# Patient Record
Sex: Male | Born: 1985
Health system: Southern US, Community
[De-identification: ages and names within clinical notes are randomized; demographics above are authoritative.]

## PROBLEM LIST (undated history)

## (undated) DIAGNOSIS — N4400 Torsion of testis, unspecified: Secondary | ICD-10-CM

## (undated) HISTORY — PX: APPENDECTOMY: SHX54

## (undated) HISTORY — DX: Torsion of testis, unspecified: N44.00

## (undated) HISTORY — PX: TONSILLECTOMY: SUR1361

## (undated) HISTORY — PX: HERNIA REPAIR: SHX51

---

## 2011-04-02 ENCOUNTER — Ambulatory Visit
Admission: RE | Admit: 2011-04-02 | Discharge: 2011-04-02 | Disposition: A | Discharge status: Home / Self Care | Payer: BC Managed Care – PPO | Source: Ambulatory Visit | Attending: Otolaryngology | Admitting: Otolaryngology

## 2011-04-02 ENCOUNTER — Other Ambulatory Visit: Payer: Self-pay | Admitting: Otolaryngology

## 2011-04-02 DIAGNOSIS — J3489 Other specified disorders of nose and nasal sinuses: Secondary | ICD-10-CM

## 2011-04-02 DIAGNOSIS — J343 Hypertrophy of nasal turbinates: Secondary | ICD-10-CM

## 2011-04-02 DIAGNOSIS — J342 Deviated nasal septum: Secondary | ICD-10-CM

## 2011-06-14 ENCOUNTER — Encounter (HOSPITAL_COMMUNITY): Payer: Self-pay | Admitting: *Deleted

## 2011-06-14 ENCOUNTER — Emergency Department (HOSPITAL_COMMUNITY)
Admission: EM | Admit: 2011-06-14 | Discharge: 2011-06-14 | Disposition: A | Discharge status: Home / Self Care | Payer: BC Managed Care – PPO | Attending: Emergency Medicine | Admitting: Emergency Medicine

## 2011-06-14 ENCOUNTER — Emergency Department (HOSPITAL_COMMUNITY): Payer: BC Managed Care – PPO

## 2011-06-14 DIAGNOSIS — R10817 Generalized abdominal tenderness: Secondary | ICD-10-CM | POA: Insufficient documentation

## 2011-06-14 DIAGNOSIS — R109 Unspecified abdominal pain: Secondary | ICD-10-CM | POA: Insufficient documentation

## 2011-06-14 DIAGNOSIS — Z9089 Acquired absence of other organs: Secondary | ICD-10-CM | POA: Insufficient documentation

## 2011-06-14 LAB — DIFFERENTIAL
Lymphocytes Relative: 40 % (ref 12–46)
Lymphs Abs: 4.4 10*3/uL — ABNORMAL HIGH (ref 0.7–4.0)
Monocytes Absolute: 0.7 10*3/uL (ref 0.1–1.0)
Monocytes Relative: 7 % (ref 3–12)
Neutro Abs: 5.5 10*3/uL (ref 1.7–7.7)

## 2011-06-14 LAB — CBC
HCT: 40.7 % (ref 39.0–52.0)
Hemoglobin: 12.8 g/dL — ABNORMAL LOW (ref 13.0–17.0)
WBC: 10.9 10*3/uL — ABNORMAL HIGH (ref 4.0–10.5)

## 2011-06-14 LAB — COMPREHENSIVE METABOLIC PANEL
BUN: 12 mg/dL (ref 6–23)
CO2: 29 mEq/L (ref 19–32)
Chloride: 101 mEq/L (ref 96–112)
Creatinine, Ser: 1.1 mg/dL (ref 0.50–1.35)
GFR calc non Af Amer: >90 mL/min (ref >90)
Total Bilirubin: 0.2 mg/dL — ABNORMAL LOW (ref 0.3–1.2)

## 2011-06-14 LAB — URINALYSIS, ROUTINE W REFLEX MICROSCOPIC
Bilirubin Urine: NEGATIVE
Glucose, UA: NEGATIVE mg/dL
Hgb urine dipstick: NEGATIVE
Ketones, ur: NEGATIVE mg/dL
pH: 6 (ref 5.0–8.0)

## 2011-06-14 LAB — LIPASE, BLOOD: Lipase: 27 U/L (ref 11–59)

## 2011-06-14 MED ORDER — SODIUM CHLORIDE 0.9 % IV BOLUS (SEPSIS)
1000.0000 mL | Freq: Once | INTRAVENOUS | Status: AC
  Administered 2011-06-14: 1000 mL via INTRAVENOUS

## 2011-06-14 NOTE — Discharge Instructions (Signed)
Abdominal Pain (Nonspecific)  Your exam might not show the exact reason you have abdominal pain. Since there are many different causes of abdominal pain, another checkup and more tests may be needed. It is very important to follow up for lasting (persistent) or worsening symptoms. A possible cause of abdominal pain in any person who still has his or her appendix is acute appendicitis. Appendicitis is often hard to diagnose. Normal blood tests, urine tests, ultrasound, and CT scans do not completely rule out early appendicitis or other causes of abdominal pain. Sometimes, only the changes that happen over time will allow appendicitis and other causes of abdominal pain to be determined. Other potential problems that may require surgery may also take time to become more apparent. Because of this, it is important that you follow all of the instructions below.  HOME CARE INSTRUCTIONS    Rest as much as possible.   Do not eat solid food until your pain is gone.   While adults or children have pain: A diet of water, weak decaffeinated tea, broth or bouillon, gelatin, oral rehydration solutions (ORS), frozen ice pops, or ice chips may be helpful.   When pain is gone in adults or children: Start a light diet (dry toast, crackers, applesauce, or white rice). Increase the diet slowly as long as it does not bother you. Eat no dairy products (including cheese and eggs) and no spicy, fatty, fried, or high-fiber foods.   Use no alcohol, caffeine, or cigarettes.   Take your regular medicines unless your caregiver told you not to.   Take any prescribed medicine as directed.   Only take over-the-counter or prescription medicines for pain, discomfort, or fever as directed by your caregiver. Do not give aspirin to children.  If your caregiver has given you a follow-up appointment, it is very important to keep that appointment. Not keeping the appointment could result in a permanent injury and/or lasting (chronic) pain and/or  disability. If there is any problem keeping the appointment, you must call to reschedule.   SEEK IMMEDIATE MEDICAL CARE IF:    Your pain is not gone in 24 hours.   Your pain becomes worse, changes location, or feels different.   You or your child has an oral temperature above 102 F (38.9 C), not controlled by medicine.   Your baby is older than 3 months with a rectal temperature of 102 F (38.9 C) or higher.   Your baby is 3 months old or younger with a rectal temperature of 100.4 F (38 C) or higher.   You have shaking chills.   You keep throwing up (vomiting) or cannot drink liquids.   There is blood in your vomit or you see blood in your bowel movements.   Your bowel movements become dark or black.   You have frequent bowel movements.   Your bowel movements stop (become blocked) or you cannot pass gas.   You have bloody, frequent, or painful urination.   You have yellow discoloration in the skin or whites of the eyes.   Your stomach becomes bloated or bigger.   You have dizziness or fainting.   You have chest or back pain.  MAKE SURE YOU:    Understand these instructions.   Will watch your condition.   Will get help right away if you are not doing well or get worse.  Document Released: 03/15/2005 Document Revised: 03/04/2011 Document Reviewed: 02/10/2009  [employer] Patient Information 2012 [employer].

## 2011-06-14 NOTE — ED Provider Notes (Signed)
History     CSN: [id number]  Arrival date & time 06/14/11  0505   First MD Initiated Contact with Patient 06/14/11 0557      Chief Complaint  Patient presents with  . Abdominal Pain    (Consider location/radiation/quality/duration/timing/severity/associated sxs/prior treatment) HPI  26 year old male with a prior history of appendectomy presents with abdominal pain. Patient states he exhibits a gradual onset of periumbilical abd pain since last night. Described pain as a sharp, stabbing sensation that radiates up and down his abdomen. Pain is constant, sometimes worsened with positional changes. Pain felt similar to this prior appendicitis back in 2001. Patient denies fever, headache, chest pain, shortness of breath, nausea, vomiting, back pain, dysuria, rash. He denies any recent recreational drug use or alcohol use. He denies any recent trauma or recent strenuous exercise. Patient has not tried anything to alleviate his pain. Pain has kept him up all night last night. He denies history of hernia.  Sts he's able to pass flatus.  History reviewed. No pertinent past medical history.  Past Surgical History  Procedure Date  . Appendectomy   . Tonsillectomy     History reviewed. No pertinent family history.  History  Substance Use Topics  . Smoking status: Not on file  . Smokeless tobacco: Not on file  . Alcohol Use: No      Review of Systems  All other systems reviewed and are negative.    Allergies  Review of patient's allergies indicates no known allergies.  Home Medications  No current outpatient prescriptions on file.  BP 152/84  Pulse 77  Temp(Src) 98 F (36.7 C) (Oral)  SpO2 99%  Physical Exam  Nursing note and vitals reviewed. Constitutional: He appears well-developed and well-nourished. No distress.       Awake, alert, nontoxic appearance  HENT:  Head: Atraumatic.  Eyes: Conjunctivae are normal. Right eye exhibits no discharge. Left eye exhibits no  discharge.  Neck: Normal range of motion. Neck supple.  Cardiovascular: Normal rate and regular rhythm.   Pulmonary/Chest: Effort normal. No respiratory distress. He exhibits no tenderness.  Abdominal: Soft. Normal appearance and bowel sounds are normal. He exhibits no shifting dullness, no distension, no abdominal bruit and no ascites. There is generalized tenderness. There is no rigidity, no rebound, no guarding, no CVA tenderness, no tenderness at McBurney's point and negative Murphy's sign. No hernia. Hernia confirmed negative in the ventral area, confirmed negative in the right inguinal area and confirmed negative in the left inguinal area.         Diffuse abdominal pain on palpation, no guarding, no rebound tenderness.  Musculoskeletal: He exhibits no tenderness.       ROM appears intact, no obvious focal weakness  Neurological: He is alert.  Skin: Skin is warm and dry. No rash noted.  Psychiatric: He has a normal mood and affect.    ED Course  Procedures (including critical care time)   Labs Reviewed  CBC  DIFFERENTIAL  COMPREHENSIVE METABOLIC PANEL  LIPASE, BLOOD   No results found.   No diagnosis found.  Results for orders placed during the hospital encounter of 06/14/11  CBC      Component Value Range   WBC 10.9 (*) 4.0 - 10.5 (K/uL)   RBC 5.08  4.22 - 5.81 (MIL/uL)   Hemoglobin 12.8 (*) 13.0 - 17.0 (g/dL)   HCT 40.9  81.1 - 91.4 (%)   MCV 80.1  78.0 - 100.0 (fL)   MCH 25.2 (*) 26.0 - 34.0 (  pg)   MCHC 31.4  30.0 - 36.0 (g/dL)   RDW 09.8  11.9 - 14.7 (%)   Platelets 375  150 - 400 (K/uL)  DIFFERENTIAL      Component Value Range   Neutrophils Relative 50  43 - 77 (%)   Neutro Abs 5.5  1.7 - 7.7 (K/uL)   Lymphocytes Relative 40  12 - 46 (%)   Lymphs Abs 4.4 (*) 0.7 - 4.0 (K/uL)   Monocytes Relative 7  3 - 12 (%)   Monocytes Absolute 0.7  0.1 - 1.0 (K/uL)   Eosinophils Relative 2  0 - 5 (%)   Eosinophils Absolute 0.2  0.0 - 0.7 (K/uL)   Basophils Relative 1   0 - 1 (%)   Basophils Absolute 0.1  0.0 - 0.1 (K/uL)  COMPREHENSIVE METABOLIC PANEL      Component Value Range   Sodium 138  135 - 145 (mEq/L)   Potassium 3.6  3.5 - 5.1 (mEq/L)   Chloride 101  96 - 112 (mEq/L)   CO2 29  19 - 32 (mEq/L)   Glucose, Bld 94  70 - 99 (mg/dL)   BUN 12  6 - 23 (mg/dL)   Creatinine, Ser 8.29  0.50 - 1.35 (mg/dL)   Calcium 9.0  8.4 - 56.2 (mg/dL)   Total Protein 8.1  6.0 - 8.3 (g/dL)   Albumin 3.7  3.5 - 5.2 (g/dL)   AST 21  0 - 37 (U/L)   ALT 20  0 - 53 (U/L)   Alkaline Phosphatase 87  39 - 117 (U/L)   Total Bilirubin 0.2 (*) 0.3 - 1.2 (mg/dL)   GFR calc non Af Amer >90  >90 (mL/min)   GFR calc Af Amer >90  >90 (mL/min)  LIPASE, BLOOD      Component Value Range   Lipase 27  11 - 59 (U/L)  URINALYSIS, ROUTINE W REFLEX MICROSCOPIC      Component Value Range   Color, Urine YELLOW  YELLOW    APPearance CLEAR  CLEAR    Specific Gravity, Urine 1.031 (*) 1.005 - 1.030    pH 6.0  5.0 - 8.0    Glucose, UA NEGATIVE  NEGATIVE (mg/dL)   Hgb urine dipstick NEGATIVE  NEGATIVE    Bilirubin Urine NEGATIVE  NEGATIVE    Ketones, ur NEGATIVE  NEGATIVE (mg/dL)   Protein, ur NEGATIVE  NEGATIVE (mg/dL)   Urobilinogen, UA 1.0  0.0 - 1.0 (mg/dL)   Nitrite NEGATIVE  NEGATIVE    Leukocytes, UA NEGATIVE  NEGATIVE    Dg Abd Acute W/chest  06/14/2011  *RADIOLOGY REPORT*  Clinical Data: Abdominal pain.  ACUTE ABDOMEN SERIES (ABDOMEN 2 VIEW & CHEST 1 VIEW)  Comparison: None.  Findings: The lungs are well-aerated.  Mild vascular congestion is noted.  There is no evidence of focal opacification, pleural effusion or pneumothorax.  The cardiomediastinal silhouette is borderline normal in size.  The visualized bowel gas pattern is unremarkable.  The colon is partially filled with stool; there is no evidence of small bowel dilatation to suggest obstruction.  No free intra-abdominal air is identified on the provided upright view.  No acute osseous abnormalities are seen; the sacroiliac  joints are unremarkable in appearance.  IMPRESSION:  1.  Unremarkable bowel gas pattern; no free intra-abdominal air seen. 2.  Mild vascular congestion noted; lungs remain grossly clear.  Original Report Authenticated By: Tonia Ghent, M.D.      MDM  Periumbilical abd pain since last night.  Hx of appendectomy.  Able to pass flatus.  Is afebrile.  Work up initiated.  Will obtain acute abdomen xray to r/o obstruction due to prior abd surgery  7:33 AM Pt request no pain medication at this time.  Sts his pain has improved from earlier.  Acute abd xray shows no significant finding concerning for obstruction.  Pt has mildly elevated WBC of 10.9.  Normal renal function.  No evidence of hernia.  My attending has evaluated pt and felt that if his UA is negative, pt can be discharge without further work up.    8:20 AM UA shows no signs of infection.  Patient states his pain has been improving. I recommend for him to return to ER if he has any red flags finding. Patient agrees with plan. Patient will be discharged. He has stable normal vital signs and is afebrile.    Fayrene Helper, PA-C 06/14/11 [phone]

## 2011-06-14 NOTE — ED Provider Notes (Signed)
Medical screening examination/treatment/procedure(s) were conducted as a shared visit with non-physician practitioner(s) and myself.  I personally evaluated the patient during the encounter  abd exam and non-surgical--labs reviewed, awaiting urine, likely non-specific abd pain, will likely d/c  Toy Baker, MD 06/14/11 [phone]

## 2011-06-14 NOTE — ED Notes (Signed)
Pt aware he must provide a urine sample.

## 2011-06-18 NOTE — ED Provider Notes (Signed)
Medical screening examination/treatment/procedure(s) were performed by non-physician practitioner and as supervising physician I was immediately available for consultation/collaboration.  Assunta Pupo, MD 06/18/11 1926 

## 2012-05-17 ENCOUNTER — Ambulatory Visit: Payer: BC Managed Care – PPO

## 2012-05-17 ENCOUNTER — Ambulatory Visit: Payer: BC Managed Care – PPO | Admitting: Family Medicine

## 2012-05-17 VITALS — BP 135/80 | HR 88 | Temp 98.5°F | Resp 18 | Ht 75.0 in | Wt >= 6400 oz

## 2012-05-17 DIAGNOSIS — K5289 Other specified noninfective gastroenteritis and colitis: Secondary | ICD-10-CM

## 2012-05-17 DIAGNOSIS — R0602 Shortness of breath: Secondary | ICD-10-CM

## 2012-05-17 LAB — POCT CBC
Granulocyte percent: 52.7 %G (ref 37–80)
HCT, POC: 42.5 % — AB (ref 43.5–53.7)
Hemoglobin: 13.5 g/dL — AB (ref 14.1–18.1)
Lymph, poc: 3.1 (ref 0.6–3.4)
MCH, POC: 25.5 pg — AB (ref 27–31.2)
MCHC: 31.8 g/dL (ref 31.8–35.4)
MCV: 80.1 fL (ref 80–97)
MID (cbc): 0.5 (ref 0–0.9)
MPV: 8.8 fL (ref 0–99.8)
POC Granulocyte: 4.1 (ref 2–6.9)
POC LYMPH PERCENT: 40.2 %L (ref 10–50)
POC MID %: 7.1 %M (ref 0–12)
Platelet Count, POC: 395 10*3/uL (ref 142–424)
RBC: 5.3 M/uL (ref 4.69–6.13)
RDW, POC: 14.1 %
WBC: 7.7 10*3/uL (ref 4.6–10.2)

## 2012-05-17 NOTE — Progress Notes (Signed)
[facility]:  Office Visit  Chief Complaint:  Chief Complaint  Patient presents with  . Shortness of Breath    had stomach virus 2 days ago    HPI: Dustin Collins is a 27 y.o. male who complains of  SOB at rest and also with exertion, this was after he had a bout of the the stomach flu : n/v/diarrhea all stopped yesterday. Taking in fluids, water and gingerale. Ate soup. Had 15 x emesis when this occurred. Nonbloody diarrhea. Started "Sunday morning, went out to eat Saturday night at [employer]. Had chicken fingers and boneless chicken. No recent illness.  He has msk aches. No h/o asthma or allergies. Denies fevers, chills, cough, sinus pressure.   Denies any risk factors for PE. No recent long trips, cancer, recent injuries/surgeries.    Past Medical History  Diagnosis Date  . Testicular torsion     27 y/o   Past Surgical History  Procedure Laterality Date  . Appendectomy    . Tonsillectomy     History   Social History  . Marital Status: Married    Spouse Name: N/A    Number of Children: N/A  . Years of Education: N/A   Social History Main Topics  . Smoking status: Never Smoker   . Smokeless tobacco: None  . Alcohol Use: No  . Drug Use: No  . Sexually Active: Yes   Other Topics Concern  . None   Social History Narrative  . None   Family History  Problem Relation Age of Onset  . Pneumonia Mother    No Known Allergies Prior to Admission medications   Not on File     ROS: The patient denies fevers, chills, night sweats, unintentional weight loss, wheezing, nausea, vomiting, abdominal pain, dysuria, hematuria, melena, numbness, weakness, or tingling.  All other systems have been reviewed and were otherwise negative with the exception of those mentioned in the HPI and as above.    PHYSICAL EXAM: Filed Vitals:   05/17/12 1556  BP: 135/80  Pulse: 88  Temp: 98.5 F (36.9 C)  Resp: 18  Spo2      98" % Filed Vitals:   05/17/12 1556  Height: 6'  3" (1.905 m)  Weight: 418 lb (189.604 kg)   Body mass index is 52.25 kg/(m^2).  General: Alert, no acute distress. Obese AA male HEENT:  Normocephalic, atraumatic, oropharynx patent. EOMI, PERRLA, no exudates. TM nl. Fundoscopic exam nl.  Cardiovascular:  Regular rate and rhythm, no rubs murmurs or gallops.  No Carotid bruits, radial pulse intact. No pedal edema.  Respiratory: Clear to auscultation bilaterally.  No wheezes, rales, or rhonchi.  No cyanosis, no use of accessory musculature GI: No organomegaly, abdomen is soft and non-tender, positive bowel sounds.  No masses. Skin: No rashes. Neurologic: Facial musculature symmetric. Psychiatric: Patient is appropriate throughout our interaction. Lymphatic: No cervical lymphadenopathy Musculoskeletal: Gait intact.   LABS: Results for orders placed in visit on 05/17/12  POCT CBC      Result Value Range   WBC 7.7  4.6 - 10.2 K/uL   Lymph, poc 3.1  0.6 - 3.4   POC LYMPH PERCENT 40.2  10 - 50 %L   MID (cbc) 0.5  0 - 0.9   POC MID % 7.1  0 - 12 %M   POC Granulocyte 4.1  2 - 6.9   Granulocyte percent 52.7  37 - 80 %G   RBC 5.30  4.69 - 6.13 M/uL   Hemoglobin  13.5 (*) 14.1 - 18.1 g/dL   HCT, POC 16.1 (*) 09.6 - 53.7 %   MCV 80.1  80 - 97 fL   MCH, POC 25.5 (*) 27 - 31.2 pg   MCHC 31.8  31.8 - 35.4 g/dL   RDW, POC 04.5     Platelet Count, POC 395  142 - 424 K/uL   MPV 8.8  0 - 99.8 fL     EKG/XRAY:   Primary read interpreted by Dr. Conley Rolls at [facility] No acute cardiopulmonary process   ASSESSMENT/PLAN: Encounter Diagnoses  Name Primary?  . SOB (shortness of breath) Yes  . Other and unspecified noninfectious gastroenteritis and colitis    Most likely related to chestwall pain/SOB due to pain from frequent episodes of emesis from recent gastroenteritis.  No excessive exercise for now, Ibuprofen prn for pain. Advise to take with food.  I do not expect this to be heart related and also unlikley PE since risk factors are low His anemia  is actually improving from prior visits, rectal exam declined If sxs don't improve then f/u prn     LE, THAO PHUONG, DO 05/17/2012 6:09 PM

## 2017-06-01 ENCOUNTER — Other Ambulatory Visit: Payer: Self-pay

## 2017-06-01 ENCOUNTER — Ambulatory Visit: Payer: BC Managed Care – PPO | Admitting: Emergency Medicine

## 2017-06-01 ENCOUNTER — Encounter: Payer: Self-pay | Admitting: Emergency Medicine

## 2017-06-01 VITALS — BP 141/87 | HR 89 | Temp 98.4°F | Resp 18 | Ht 73.03 in | Wt >= 6400 oz

## 2017-06-01 DIAGNOSIS — Z6841 Body Mass Index (BMI) 40.0 and over, adult: Secondary | ICD-10-CM | POA: Diagnosis not present

## 2017-06-01 DIAGNOSIS — Z0000 Encounter for general adult medical examination without abnormal findings: Secondary | ICD-10-CM | POA: Diagnosis not present

## 2017-06-01 LAB — POCT URINALYSIS DIP (MANUAL ENTRY)
Bilirubin, UA: NEGATIVE
Glucose, UA: NEGATIVE mg/dL
Ketones, POC UA: NEGATIVE mg/dL
LEUKOCYTES UA: NEGATIVE
NITRITE UA: NEGATIVE
PH UA: 7 (ref 5.0–8.0)
Protein Ur, POC: NEGATIVE mg/dL
RBC UA: NEGATIVE
Spec Grav, UA: 1.015 (ref 1.010–1.025)
UROBILINOGEN UA: 0.2 U/dL

## 2017-06-01 NOTE — Patient Instructions (Addendum)
   IF you received an x-ray today, you will receive an invoice from [facility]. Please contact [facility] at [phone] with questions or concerns regarding your invoice.   IF you received labwork today, you will receive an invoice from [facility]. Please contact [facility] at [phone] with questions or concerns regarding your invoice.   Our billing staff will not be able to assist you with questions regarding bills from these companies.  You will be contacted with the lab results as soon as they are available. The fastest way to get your results is to activate your My Chart account. Instructions are located on the last page of this paperwork. If you have not heard from us regarding the results in 2 weeks, please contact this office.      Health Maintenance, Male A healthy lifestyle and preventive care is important for your health and wellness. Ask your health care provider about what schedule of regular examinations is right for you. What should I know about weight and diet? Eat a Healthy Diet  Eat plenty of vegetables, fruits, whole grains, low-fat dairy products, and lean protein.  Do not eat a lot of foods high in solid fats, added sugars, or salt.  Maintain a Healthy Weight Regular exercise can help you achieve or maintain a healthy weight. You should:  Do at least 150 minutes of exercise each week. The exercise should increase your heart rate and make you sweat (moderate-intensity exercise).  Do strength-training exercises at least twice a week.  Watch Your Levels of Cholesterol and Blood Lipids  Have your blood tested for lipids and cholesterol every 5 years starting at 31 years of age. If you are at high risk for heart disease, you should start having your blood tested when you are 31 years old. You may need to have your cholesterol levels checked more often if: ? Your lipid or cholesterol levels are high. ? You are older than 31 years of age. ? You  are at high risk for heart disease.  What should I know about cancer screening? Many types of cancers can be detected early and may often be prevented. Lung Cancer  You should be screened every year for lung cancer if: ? You are a current smoker who has smoked for at least 30 years. ? You are a former smoker who has quit within the past 15 years.  Talk to your health care provider about your screening options, when you should start screening, and how often you should be screened.  Colorectal Cancer  Routine colorectal cancer screening usually begins at 31 years of age and should be repeated every 5-10 years until you are 31 years old. You may need to be screened more often if early forms of precancerous polyps or small growths are found. Your health care provider may recommend screening at an earlier age if you have risk factors for colon cancer.  Your health care provider may recommend using home test kits to check for hidden blood in the stool.  A small camera at the end of a tube can be used to examine your colon (sigmoidoscopy or colonoscopy). This checks for the earliest forms of colorectal cancer.  Prostate and Testicular Cancer  Depending on your age and overall health, your health care provider may do certain tests to screen for prostate and testicular cancer.  Talk to your health care provider about any symptoms or concerns you have about testicular or prostate cancer.  Skin Cancer  Check your skin   from head to toe regularly.  Tell your health care provider about any new moles or changes in moles, especially if: ? There is a change in a mole's size, shape, or color. ? You have a mole that is larger than a pencil eraser.  Always use sunscreen. Apply sunscreen liberally and repeat throughout the day.  Protect yourself by wearing long sleeves, pants, a wide-brimmed hat, and sunglasses when outside.  What should I know about heart disease, diabetes, and high blood  pressure?  If you are 18-39 years of age, have your blood pressure checked every 3-5 years. If you are 40 years of age or older, have your blood pressure checked every year. You should have your blood pressure measured twice-once when you are at a hospital or clinic, and once when you are not at a hospital or clinic. Record the average of the two measurements. To check your blood pressure when you are not at a hospital or clinic, you can use: ? An automated blood pressure machine at a pharmacy. ? A home blood pressure monitor.  Talk to your health care provider about your target blood pressure.  If you are between 45-79 years old, ask your health care provider if you should take aspirin to prevent heart disease.  Have regular diabetes screenings by checking your fasting blood sugar level. ? If you are at a normal weight and have a low risk for diabetes, have this test once every three years after the age of 45. ? If you are overweight and have a high risk for diabetes, consider being tested at a younger age or more often.  A one-time screening for abdominal aortic aneurysm (AAA) by ultrasound is recommended for men aged 65-75 years who are current or former smokers. What should I know about preventing infection? Hepatitis B If you have a higher risk for hepatitis B, you should be screened for this virus. Talk with your health care provider to find out if you are at risk for hepatitis B infection. Hepatitis C Blood testing is recommended for:  Everyone born from 1945 through 1965.  Anyone with known risk factors for hepatitis C.  Sexually Transmitted Diseases (STDs)  You should be screened each year for STDs including gonorrhea and chlamydia if: ? You are sexually active and are younger than 31 years of age. ? You are older than 31 years of age and your health care provider tells you that you are at risk for this type of infection. ? Your sexual activity has changed since you were last  screened and you are at an increased risk for chlamydia or gonorrhea. Ask your health care provider if you are at risk.  Talk with your health care provider about whether you are at high risk of being infected with HIV. Your health care provider may recommend a prescription medicine to help prevent HIV infection.  What else can I do?  Schedule regular health, dental, and eye exams.  Stay current with your vaccines (immunizations).  Do not use any tobacco products, such as cigarettes, chewing tobacco, and e-cigarettes. If you need help quitting, ask your health care provider.  Limit alcohol intake to no more than 2 drinks per day. One drink equals 12 ounces of beer, 5 ounces of wine, or 1 ounces of hard liquor.  Do not use street drugs.  Do not share needles.  Ask your health care provider for help if you need support or information about quitting drugs.  Tell your health care   provider if you often feel depressed.  Tell your health care provider if you have ever been abused or do not feel safe at home. This information is not intended to replace advice given to you by your health care provider. Make sure you discuss any questions you have with your health care provider. Document Released: 09/11/2007 Document Revised: 11/12/2015 Document Reviewed: 12/17/2014 [employer] Interactive Patient Education  2018 [employer]   Calorie Counting for Weight Loss Calories are units of energy. Your body needs a certain amount of calories from food to keep you going throughout the day. When you eat more calories than your body needs, your body stores the extra calories as fat. When you eat fewer calories than your body needs, your body burns fat to get the energy it needs. Calorie counting means keeping track of how many calories you eat and drink each day. Calorie counting can be helpful if you need to lose weight. If you make sure to eat fewer calories than your body needs, you should lose weight.  Ask your health care provider what a healthy weight is for you. For calorie counting to work, you will need to eat the right number of calories in a day in order to lose a healthy amount of weight per week. A dietitian can help you determine how many calories you need in a day and will give you suggestions on how to reach your calorie goal.  A healthy amount of weight to lose per week is usually 1-2 lb (0.5-0.9 kg). This usually means that your daily calorie intake should be reduced by 500-750 calories.  Eating 1,200 - 1,500 calories per day can help most women lose weight.  Eating 1,500 - 1,800 calories per day can help most men lose weight.  What is my plan? My goal is to have __________ calories per day. If I have this many calories per day, I should lose around __________ pounds per week. What do I need to know about calorie counting? In order to meet your daily calorie goal, you will need to:  Find out how many calories are in each food you would like to eat. Try to do this before you eat.  Decide how much of the food you plan to eat.  Write down what you ate and how many calories it had. Doing this is called keeping a food log.  To successfully lose weight, it is important to balance calorie counting with a healthy lifestyle that includes regular activity. Aim for 150 minutes of moderate exercise (such as walking) or 75 minutes of vigorous exercise (such as running) each week. Where do I find calorie information?  The number of calories in a food can be found on a Nutrition Facts label. If a food does not have a Nutrition Facts label, try to look up the calories online or ask your dietitian for help. Remember that calories are listed per serving. If you choose to have more than one serving of a food, you will have to multiply the calories per serving by the amount of servings you plan to eat. For example, the label on a package of bread might say that a serving size is 1 slice and that  there are 90 calories in a serving. If you eat 1 slice, you will have eaten 90 calories. If you eat 2 slices, you will have eaten 180 calories. How do I keep a food log? Immediately after each meal, record the following information in your food log:    What you ate. Don't forget to include toppings, sauces, and other extras on the food.  How much you ate. This can be measured in cups, ounces, or number of items.  How many calories each food and drink had.  The total number of calories in the meal.  Keep your food log near you, such as in a small notebook in your pocket, or use a mobile app or website. Some programs will calculate calories for you and show you how many calories you have left for the day to meet your goal. What are some calorie counting tips?  Use your calories on foods and drinks that will fill you up and not leave you hungry: ? Some examples of foods that fill you up are nuts and nut butters, vegetables, lean proteins, and high-fiber foods like whole grains. High-fiber foods are foods with more than 5 g fiber per serving. ? Drinks such as sodas, specialty coffee drinks, alcohol, and juices have a lot of calories, yet do not fill you up.  Eat nutritious foods and avoid empty calories. Empty calories are calories you get from foods or beverages that do not have many vitamins or protein, such as candy, sweets, and soda. It is better to have a nutritious high-calorie food (such as an avocado) than a food with few nutrients (such as a bag of chips).  Know how many calories are in the foods you eat most often. This will help you calculate calorie counts faster.  Pay attention to calories in drinks. Low-calorie drinks include water and unsweetened drinks.  Pay attention to nutrition labels for "low fat" or "fat free" foods. These foods sometimes have the same amount of calories or more calories than the full fat versions. They also often have added sugar, starch, or salt, to make up  for flavor that was removed with the fat.  Find a way of tracking calories that works for you. Get creative. Try different apps or programs if writing down calories does not work for you. What are some portion control tips?  Know how many calories are in a serving. This will help you know how many servings of a certain food you can have.  Use a measuring cup to measure serving sizes. You could also try weighing out portions on a kitchen scale. With time, you will be able to estimate serving sizes for some foods.  Take some time to put servings of different foods on your favorite plates, bowls, and cups so you know what a serving looks like.  Try not to eat straight from a bag or box. Doing this can lead to overeating. Put the amount you would like to eat in a cup or on a plate to make sure you are eating the right portion.  Use smaller plates, glasses, and bowls to prevent overeating.  Try not to multitask (for example, watch TV or use your computer) while eating. If it is time to eat, sit down at a table and enjoy your food. This will help you to know when you are full. It will also help you to be aware of what you are eating and how much you are eating. What are tips for following this plan? Reading food labels  Check the calorie count compared to the serving size. The serving size may be smaller than what you are used to eating.  Check the source of the calories. Make sure the food you are eating is high in vitamins and protein and low in   saturated and trans fats. Shopping  Read nutrition labels while you shop. This will help you make healthy decisions before you decide to purchase your food.  Make a grocery list and stick to it. Cooking  Try to cook your favorite foods in a healthier way. For example, try baking instead of frying.  Use low-fat dairy products. Meal planning  Use more fruits and vegetables. Half of your plate should be fruits and vegetables.  Include lean  proteins like poultry and fish. How do I count calories when eating out?  Ask for smaller portion sizes.  Consider sharing an entree and sides instead of getting your own entree.  If you get your own entree, eat only half. Ask for a box at the beginning of your meal and put the rest of your entree in it so you are not tempted to eat it.  If calories are listed on the menu, choose the lower calorie options.  Choose dishes that include vegetables, fruits, whole grains, low-fat dairy products, and lean protein.  Choose items that are boiled, broiled, grilled, or steamed. Stay away from items that are buttered, battered, fried, or served with cream sauce. Items labeled "crispy" are usually fried, unless stated otherwise.  Choose water, low-fat milk, unsweetened iced tea, or other drinks without added sugar. If you want an alcoholic beverage, choose a lower calorie option such as a glass of wine or light beer.  Ask for dressings, sauces, and syrups on the side. These are usually high in calories, so you should limit the amount you eat.  If you want a salad, choose a garden salad and ask for grilled meats. Avoid extra toppings like bacon, cheese, or fried items. Ask for the dressing on the side, or ask for olive oil and vinegar or lemon to use as dressing.  Estimate how many servings of a food you are given. For example, a serving of cooked rice is  cup or about the size of half a baseball. Knowing serving sizes will help you be aware of how much food you are eating at restaurants. The list below tells you how big or small some common portion sizes are based on everyday objects: ? 1 oz-4 stacked dice. ? 3 oz-1 deck of cards. ? 1 tsp-1 die. ? 1 Tbsp- a ping-pong ball. ? 2 Tbsp-1 ping-pong ball. ?  cup- baseball. ? 1 cup-1 baseball. Summary  Calorie counting means keeping track of how many calories you eat and drink each day. If you eat fewer calories than your body needs, you should lose  weight.  A healthy amount of weight to lose per week is usually 1-2 lb (0.5-0.9 kg). This usually means reducing your daily calorie intake by 500-750 calories.  The number of calories in a food can be found on a Nutrition Facts label. If a food does not have a Nutrition Facts label, try to look up the calories online or ask your dietitian for help.  Use your calories on foods and drinks that will fill you up, and not on foods and drinks that will leave you hungry.  Use smaller plates, glasses, and bowls to prevent overeating. This information is not intended to replace advice given to you by your health care provider. Make sure you discuss any questions you have with your health care provider. Document Released: 03/15/2005 Document Revised: 02/13/2016 Document Reviewed: 02/13/2016 [employer] Interactive Patient Education  2018 [employer]  

## 2017-06-01 NOTE — Progress Notes (Signed)
Dustin Collins 31 y.o.   Chief Complaint  Patient presents with  . Establish Care  . Annual Exam    HISTORY OF PRESENT ILLNESS: This is a 32 y.o. male Here for annual exam; no complaints and no medical concerns.   HPI   Prior to Admission medications   Not on File    No Known Allergies  There are no active problems to display for this patient.   Past Medical History:  Diagnosis Date  . Testicular torsion    32 y/o    Past Surgical History:  Procedure Laterality Date  . APPENDECTOMY    . TONSILLECTOMY      Social History   Socioeconomic History  . Marital status: Married    Spouse name: [place]  . Number of children: 0  . Years of education: Not on file  . Highest education level: Not on file  Social Needs  . Financial resource strain: Not on file  . Food insecurity - worry: Not on file  . Food insecurity - inability: Not on file  . Transportation needs - medical: Not on file  . Transportation needs - non-medical: Not on file  Occupational History  . Not on file  Tobacco Use  . Smoking status: Never Smoker  . Smokeless tobacco: Never Used  Substance and Sexual Activity  . Alcohol use: No  . Drug use: No  . Sexual activity: Yes  Other Topics Concern  . Not on file  Social History Narrative  . Not on file    Family History  Problem Relation Age of Onset  . Pneumonia Mother      Review of Systems  Constitutional: Negative.  Negative for chills and fever.  HENT: Negative.  Negative for congestion, hearing loss, nosebleeds and sore throat.   Eyes: Negative.  Negative for blurred vision and double vision.  Respiratory: Negative.  Negative for cough and shortness of breath.   Cardiovascular: Negative.  Negative for chest pain, palpitations and leg swelling.  Gastrointestinal: Negative.  Negative for abdominal pain, nausea and vomiting.  Genitourinary: Negative.  Negative for dysuria and hematuria.  Musculoskeletal: Negative.  Negative for back  pain, myalgias and neck pain.  Skin: Negative.  Negative for rash.  Neurological: Negative.  Negative for headaches.  Endo/Heme/Allergies: Negative.   All other systems reviewed and are negative.    Physical Exam  Constitutional: He is oriented to person, place, and time. He appears well-developed.  Morbidly obese  HENT:  Head: Normocephalic and atraumatic.  Nose: Nose normal.  Mouth/Throat: Oropharynx is clear and moist.  Eyes: Conjunctivae and EOM are normal. Pupils are equal, round, and reactive to light.  Neck: Normal range of motion. Neck supple.  Cardiovascular: Normal rate, regular rhythm and normal heart sounds.  Pulmonary/Chest: Effort normal and breath sounds normal.  Abdominal: Soft. There is no tenderness. A hernia (Umbilical) is present.  Musculoskeletal: Normal range of motion.  Lymphadenopathy:    He has no cervical adenopathy.  Neurological: He is alert and oriented to person, place, and time.  Skin: Skin is warm and dry. Capillary refill takes less than 2 seconds.  Psychiatric: He has a normal mood and affect. His behavior is normal.     ASSESSMENT & PLAN: [name] was seen today for establish care and annual exam.  Diagnoses and all orders for this visit:  Routine general medical examination at a health care facility -     CBC with Differential -     Comprehensive metabolic panel -  Hemoglobin A1c -     HIV antibody -     TSH -     Amb ref to Medical Nutrition Therapy-MNT -     POCT urinalysis dipstick  Body mass index (BMI) of 50-59.9 in adult [facility])    Patient Instructions       IF you received an x-ray today, you will receive an invoice from [facility]. Please contact [facility] at [phone] with questions or concerns regarding your invoice.   IF you received labwork today, you will receive an invoice from [facility]. Please contact [facility] at [phone] with questions or concerns regarding your invoice.   Our billing  staff will not be able to assist you with questions regarding bills from these companies.  You will be contacted with the lab results as soon as they are available. The fastest way to get your results is to activate your My Chart account. Instructions are located on the last page of this paperwork. If you have not heard from [place] regarding the results in 2 weeks, please contact this office.      Health Maintenance, Male A healthy lifestyle and preventive care is important for your health and wellness. Ask your health care provider about what schedule of regular examinations is right for you. What should I know about weight and diet? Eat a Healthy Diet  Eat plenty of vegetables, fruits, whole grains, low-fat dairy products, and lean protein.  Do not eat a lot of foods high in solid fats, added sugars, or salt.  Maintain a Healthy Weight Regular exercise can help you achieve or maintain a healthy weight. You should:  Do at least 150 minutes of exercise each week. The exercise should increase your heart rate and make you sweat (moderate-intensity exercise).  Do strength-training exercises at least twice a week.  Watch Your Levels of Cholesterol and Blood Lipids  Have your blood tested for lipids and cholesterol every 5 years starting at 31 years of age. If you are at high risk for heart disease, you should start having your blood tested when you are 31 years old. You may need to have your cholesterol levels checked more often if: ? Your lipid or cholesterol levels are high. ? You are older than 31 years of age. ? You are at high risk for heart disease.  What should I know about cancer screening? Many types of cancers can be detected early and may often be prevented. Lung Cancer  You should be screened every year for lung cancer if: ? You are a current smoker who has smoked for at least 30 years. ? You are a former smoker who has quit within the past 15 years.  Talk to your health  care provider about your screening options, when you should start screening, and how often you should be screened.  Colorectal Cancer  Routine colorectal cancer screening usually begins at 31 years of age and should be repeated every 5-10 years until you are 31 years old. You may need to be screened more often if early forms of precancerous polyps or small growths are found. Your health care provider may recommend screening at an earlier age if you have risk factors for colon cancer.  Your health care provider may recommend using home test kits to check for hidden blood in the stool.  A small camera at the end of a tube can be used to examine your colon (sigmoidoscopy or colonoscopy). This checks for the earliest forms of colorectal cancer.  Prostate and Testicular Cancer  Depending on your age and overall health, your health care provider may do certain tests to screen for prostate and testicular cancer.  Talk to your health care provider about any symptoms or concerns you have about testicular or prostate cancer.  Skin Cancer  Check your skin from head to toe regularly.  Tell your health care provider about any new moles or changes in moles, especially if: ? There is a change in a mole's size, shape, or color. ? You have a mole that is larger than a pencil eraser.  Always use sunscreen. Apply sunscreen liberally and repeat throughout the day.  Protect yourself by wearing long sleeves, pants, a wide-brimmed hat, and sunglasses when outside.  What should I know about heart disease, diabetes, and high blood pressure?  If you are 57-8 years of age, have your blood pressure checked every 3-5 years. If you are 77 years of age or older, have your blood pressure checked every year. You should have your blood pressure measured twice-once when you are at a hospital or clinic, and once when you are not at a hospital or clinic. Record the average of the two measurements. To check your blood  pressure when you are not at a hospital or clinic, you can use: ? An automated blood pressure machine at a pharmacy. ? A home blood pressure monitor.  Talk to your health care provider about your target blood pressure.  If you are between 50-79 years old, ask your health care provider if you should take aspirin to prevent heart disease.  Have regular diabetes screenings by checking your fasting blood sugar level. ? If you are at a normal weight and have a low risk for diabetes, have this test once every three years after the age of 48. ? If you are overweight and have a high risk for diabetes, consider being tested at a younger age or more often.  A one-time screening for abdominal aortic aneurysm (AAA) by ultrasound is recommended for men aged 65-75 years who are current or former smokers. What should I know about preventing infection? Hepatitis B If you have a higher risk for hepatitis B, you should be screened for this virus. Talk with your health care provider to find out if you are at risk for hepatitis B infection. Hepatitis C Blood testing is recommended for:  Everyone born from [birth year] through 1964.  Anyone with known risk factors for hepatitis C.  Sexually Transmitted Diseases (STDs)  You should be screened each year for STDs including gonorrhea and chlamydia if: ? You are sexually active and are younger than 31 years of age. ? You are older than 31 years of age and your health care provider tells you that you are at risk for this type of infection. ? Your sexual activity has changed since you were last screened and you are at an increased risk for chlamydia or gonorrhea. Ask your health care provider if you are at risk.  Talk with your health care provider about whether you are at high risk of being infected with HIV. Your health care provider may recommend a prescription medicine to help prevent HIV infection.  What else can I do?  Schedule regular health, dental, and eye  exams.  Stay current with your vaccines (immunizations).  Do not use any tobacco products, such as cigarettes, chewing tobacco, and e-cigarettes. If you need help quitting, ask your health care provider.  Limit alcohol intake to no more than 2  drinks per day. One drink equals 12 ounces of beer, 5 ounces of wine, or 1 ounces of hard liquor.  Do not use street drugs.  Do not share needles.  Ask your health care provider for help if you need support or information about quitting drugs.  Tell your health care provider if you often feel depressed.  Tell your health care provider if you have ever been abused or do not feel safe at home. This information is not intended to replace advice given to you by your health care provider. Make sure you discuss any questions you have with your health care provider. Document Released: 09/11/2007 Document Revised: 11/12/2015 Document Reviewed: 12/17/2014 Elsevier Interactive Patient Education  2018 [employer].   Calorie Counting for [employer] Calories are units of energy. Your body needs a certain amount of calories from food to keep you going throughout the day. When you eat more calories than your body needs, your body stores the extra calories as fat. When you eat fewer calories than your body needs, your body burns fat to get the energy it needs. Calorie counting means keeping track of how many calories you eat and drink each day. Calorie counting can be helpful if you need to lose weight. If you make sure to eat fewer calories than your body needs, you should lose weight. Ask your health care provider what a healthy weight is for you. For calorie counting to work, you will need to eat the right number of calories in a day in order to lose a healthy amount of weight per week. A dietitian can help you determine how many calories you need in a day and will give you suggestions on how to reach your calorie goal.  A healthy amount of weight to lose per  week is usually 1-2 lb (0.5-0.9 kg). This usually means that your daily calorie intake should be reduced by 500-750 calories.  Eating 1,200 - 1,500 calories per day can help most women lose weight.  Eating 1,500 - 1,800 calories per day can help most men lose weight.  What is my plan? My goal is to have __________ calories per day. If I have this many calories per day, I should lose around __________ pounds per week. What do I need to know about calorie counting? In order to meet your daily calorie goal, you will need to:  Find out how many calories are in each food you would like to eat. Try to do this before you eat.  Decide how much of the food you plan to eat.  Write down what you ate and how many calories it had. Doing this is called keeping a food log.  To successfully lose weight, it is important to balance calorie counting with a healthy lifestyle that includes regular activity. Aim for 150 minutes of moderate exercise (such as walking) or 75 minutes of vigorous exercise (such as running) each week. Where do I find calorie information?  The number of calories in a food can be found on a Nutrition Facts label. If a food does not have a Nutrition Facts label, try to look up the calories online or ask your dietitian for help. Remember that calories are listed per serving. If you choose to have more than one serving of a food, you will have to multiply the calories per serving by the amount of servings you plan to eat. For example, the label on a package of bread might say that a serving size is  1 slice and that there are 90 calories in a serving. If you eat 1 slice, you will have eaten 90 calories. If you eat 2 slices, you will have eaten 180 calories. How do I keep a food log? Immediately after each meal, record the following information in your food log:  What you ate. Don't forget to include toppings, sauces, and other extras on the food.  How much you ate. This can be measured in  cups, ounces, or number of items.  How many calories each food and drink had.  The total number of calories in the meal.  Keep your food log near you, such as in a small notebook in your pocket, or use a mobile app or website. Some programs will calculate calories for you and show you how many calories you have left for the day to meet your goal. What are some calorie counting tips?  Use your calories on foods and drinks that will fill you up and not leave you hungry: ? Some examples of foods that fill you up are nuts and nut butters, vegetables, lean proteins, and high-fiber foods like whole grains. High-fiber foods are foods with more than 5 g fiber per serving. ? Drinks such as sodas, specialty coffee drinks, alcohol, and juices have a lot of calories, yet do not fill you up.  Eat nutritious foods and avoid empty calories. Empty calories are calories you get from foods or beverages that do not have many vitamins or protein, such as candy, sweets, and soda. It is better to have a nutritious high-calorie food (such as an avocado) than a food with few nutrients (such as a bag of chips).  Know how many calories are in the foods you eat most often. This will help you calculate calorie counts faster.  Pay attention to calories in drinks. Low-calorie drinks include water and unsweetened drinks.  Pay attention to nutrition labels for "low fat" or "fat free" foods. These foods sometimes have the same amount of calories or more calories than the full fat versions. They also often have added sugar, starch, or salt, to make up for flavor that was removed with the fat.  Find a way of tracking calories that works for you. Get creative. Try different apps or programs if writing down calories does not work for you. What are some portion control tips?  Know how many calories are in a serving. This will help you know how many servings of a certain food you can have.  Use a measuring cup to measure serving  sizes. You could also try weighing out portions on a kitchen scale. With time, you will be able to estimate serving sizes for some foods.  Take some time to put servings of different foods on your favorite plates, bowls, and cups so you know what a serving looks like.  Try not to eat straight from a bag or box. Doing this can lead to overeating. Put the amount you would like to eat in a cup or on a plate to make sure you are eating the right portion.  Use smaller plates, glasses, and bowls to prevent overeating.  Try not to multitask (for example, watch TV or use your computer) while eating. If it is time to eat, sit down at a table and enjoy your food. This will help you to know when you are full. It will also help you to be aware of what you are eating and how much you are eating. What are  tips for following this plan? Reading food labels  Check the calorie count compared to the serving size. The serving size may be smaller than what you are used to eating.  Check the source of the calories. Make sure the food you are eating is high in vitamins and protein and low in saturated and trans fats. Shopping  Read nutrition labels while you shop. This will help you make healthy decisions before you decide to purchase your food.  Make a grocery list and stick to it. Cooking  Try to cook your favorite foods in a healthier way. For example, try baking instead of frying.  Use low-fat dairy products. Meal planning  Use more fruits and vegetables. Half of your plate should be fruits and vegetables.  Include lean proteins like poultry and fish. How do I count calories when eating out?  Ask for smaller portion sizes.  Consider sharing an entree and sides instead of getting your own entree.  If you get your own entree, eat only half. Ask for a box at the beginning of your meal and put the rest of your entree in it so you are not tempted to eat it.  If calories are listed on the menu, choose  the lower calorie options.  Choose dishes that include vegetables, fruits, whole grains, low-fat dairy products, and lean protein.  Choose items that are boiled, broiled, grilled, or steamed. Stay away from items that are buttered, battered, fried, or served with cream sauce. Items labeled "crispy" are usually fried, unless stated otherwise.  Choose water, low-fat milk, unsweetened iced tea, or other drinks without added sugar. If you want an alcoholic beverage, choose a lower calorie option such as a glass of wine or light beer.  Ask for dressings, sauces, and syrups on the side. These are usually high in calories, so you should limit the amount you eat.  If you want a salad, choose a garden salad and ask for grilled meats. Avoid extra toppings like bacon, cheese, or fried items. Ask for the dressing on the side, or ask for olive oil and vinegar or lemon to use as dressing.  Estimate how many servings of a food you are given. For example, a serving of cooked rice is  cup or about the size of half a baseball. Knowing serving sizes will help you be aware of how much food you are eating at restaurants. The list below tells you how big or small some common portion sizes are based on everyday objects: ? 1 oz-4 stacked dice. ? 3 oz-1 deck of cards. ? 1 tsp-1 die. ? 1 Tbsp- a ping-pong ball. ? 2 Tbsp-1 ping-pong ball. ?  cup- baseball. ? 1 cup-1 baseball. Summary  Calorie counting means keeping track of how many calories you eat and drink each day. If you eat fewer calories than your body needs, you should lose weight.  A healthy amount of weight to lose per week is usually 1-2 lb (0.5-0.9 kg). This usually means reducing your daily calorie intake by 500-750 calories.  The number of calories in a food can be found on a Nutrition Facts label. If a food does not have a Nutrition Facts label, try to look up the calories online or ask your dietitian for help.  Use your calories on foods and  drinks that will fill you up, and not on foods and drinks that will leave you hungry.  Use smaller plates, glasses, and bowls to prevent overeating. This information is not intended  to replace advice given to you by your health care provider. Make sure you discuss any questions you have with your health care provider. Document Released: 03/15/2005 Document Revised: 02/13/2016 Document Reviewed: 02/13/2016 Elsevier Interactive Patient Education  2018 [employer] Inc[employer]      Edwina BarthMiguel Khoa Opdahl, MD [facility]

## 2017-06-02 LAB — HIV ANTIBODY (ROUTINE TESTING W REFLEX): HIV SCREEN 4TH GENERATION: NONREACTIVE

## 2017-06-02 LAB — COMPREHENSIVE METABOLIC PANEL
A/G RATIO: 1.1 — AB (ref 1.2–2.2)
ALT: 18 IU/L (ref 0–44)
AST: 20 IU/L (ref 0–40)
Albumin: 4 g/dL (ref 3.5–5.5)
Alkaline Phosphatase: 84 IU/L (ref 39–117)
BUN/Creatinine Ratio: 10 (ref 9–20)
BUN: 9 mg/dL (ref 6–20)
Bilirubin Total: 0.3 mg/dL (ref 0.0–1.2)
CALCIUM: 8.8 mg/dL (ref 8.7–10.2)
CO2: 27 mmol/L (ref 20–29)
Chloride: 100 mmol/L (ref 96–106)
Creatinine, Ser: 0.9 mg/dL (ref 0.76–1.27)
GFR calc Af Amer: 132 mL/min/{1.73_m2} (ref >59)
GFR, EST NON AFRICAN AMERICAN: 114 mL/min/{1.73_m2} (ref >59)
GLUCOSE: 132 mg/dL — AB (ref 65–99)
Globulin, Total: 3.5 g/dL (ref 1.5–4.5)
POTASSIUM: 4 mmol/L (ref 3.5–5.2)
Sodium: 138 mmol/L (ref 134–144)
Total Protein: 7.5 g/dL (ref 6.0–8.5)

## 2017-06-02 LAB — CBC WITH DIFFERENTIAL/PLATELET
BASOS ABS: 0 10*3/uL (ref 0.0–0.2)
Basos: 0 %
EOS (ABSOLUTE): 0.1 10*3/uL (ref 0.0–0.4)
Eos: 2 %
Hematocrit: 40 % (ref 37.5–51.0)
Hemoglobin: 13.2 g/dL (ref 13.0–17.7)
IMMATURE GRANULOCYTES: 0 %
Immature Grans (Abs): 0 10*3/uL (ref 0.0–0.1)
Lymphocytes Absolute: 2.4 10*3/uL (ref 0.7–3.1)
Lymphs: 31 %
MCH: 26.2 pg — ABNORMAL LOW (ref 26.6–33.0)
MCHC: 33 g/dL (ref 31.5–35.7)
MCV: 80 fL (ref 79–97)
MONOS ABS: 0.4 10*3/uL (ref 0.1–0.9)
Monocytes: 5 %
NEUTROS PCT: 62 %
Neutrophils Absolute: 4.8 10*3/uL (ref 1.4–7.0)
PLATELETS: 339 10*3/uL (ref 150–379)
RBC: 5.03 x10E6/uL (ref 4.14–5.80)
RDW: 14.1 % (ref 12.3–15.4)
WBC: 7.9 10*3/uL (ref 3.4–10.8)

## 2017-06-02 LAB — TSH: TSH: 1.7 u[IU]/mL (ref 0.450–4.500)

## 2017-06-02 LAB — HEMOGLOBIN A1C
ESTIMATED AVERAGE GLUCOSE: 120 mg/dL
Hgb A1c MFr Bld: 5.8 % — ABNORMAL HIGH (ref 4.8–5.6)

## 2019-02-28 ENCOUNTER — Encounter: Payer: Self-pay | Admitting: Emergency Medicine

## 2019-02-28 ENCOUNTER — Ambulatory Visit: Payer: BC Managed Care – PPO | Admitting: Emergency Medicine

## 2019-02-28 ENCOUNTER — Other Ambulatory Visit: Payer: Self-pay

## 2019-02-28 VITALS — BP 117/79 | HR 74 | Temp 98.0°F | Resp 16 | Ht 75.0 in | Wt >= 6400 oz

## 2019-02-28 DIAGNOSIS — Z6841 Body Mass Index (BMI) 40.0 and over, adult: Secondary | ICD-10-CM

## 2019-02-28 DIAGNOSIS — Z1329 Encounter for screening for other suspected endocrine disorder: Secondary | ICD-10-CM | POA: Diagnosis not present

## 2019-02-28 DIAGNOSIS — Z13220 Encounter for screening for lipoid disorders: Secondary | ICD-10-CM | POA: Diagnosis not present

## 2019-02-28 DIAGNOSIS — Z130 Encounter for screening for diseases of the blood and blood-forming organs and certain disorders involving the immune mechanism: Secondary | ICD-10-CM | POA: Diagnosis not present

## 2019-02-28 DIAGNOSIS — R7303 Prediabetes: Secondary | ICD-10-CM

## 2019-02-28 DIAGNOSIS — K429 Umbilical hernia without obstruction or gangrene: Secondary | ICD-10-CM

## 2019-02-28 DIAGNOSIS — Z0001 Encounter for general adult medical examination with abnormal findings: Secondary | ICD-10-CM

## 2019-02-28 DIAGNOSIS — Z13228 Encounter for screening for other metabolic disorders: Secondary | ICD-10-CM

## 2019-02-28 LAB — POCT GLYCOSYLATED HEMOGLOBIN (HGB A1C): Hemoglobin A1C: 5.8 % — AB (ref 4.0–5.6)

## 2019-02-28 LAB — GLUCOSE, POCT (MANUAL RESULT ENTRY): POC Glucose: 96 mg/dL (ref 70–99)

## 2019-02-28 NOTE — Progress Notes (Signed)
Dustin Collins 33 y.o.   Chief Complaint  Patient presents with  . Referral    for Hernia-umbilical    HISTORY OF PRESENT ILLNESS: This is a 33 y.o. male here for his annual exam and also requesting referral for umbilical hernia evaluation. Morbidly obese. No chronic medical problems.  No chronic medications. Blood work last year revealed prediabetes. Has no other complaints or medical concerns today. Non-smoker and no EtOH abuser.  HPI   Prior to Admission medications   Not on File    No Known Allergies  There are no active problems to display for this patient.   Past Medical History:  Diagnosis Date  . Testicular torsion    33 y/o    Past Surgical History:  Procedure Laterality Date  . APPENDECTOMY    . TONSILLECTOMY      Social History   Socioeconomic History  . Marital status: Married    Spouse name: [place]  . Number of children: 0  . Years of education: Not on file  . Highest education level: Not on file  Occupational History  . Not on file  Social Needs  . Financial resource strain: Not on file  . Food insecurity    Worry: Not on file    Inability: Not on file  . Transportation needs    Medical: Not on file    Non-medical: Not on file  Tobacco Use  . Smoking status: Never Smoker  . Smokeless tobacco: Never Used  Substance and Sexual Activity  . Alcohol use: No  . Drug use: No  . Sexual activity: Yes  Lifestyle  . Physical activity    Days per week: Not on file    Minutes per session: Not on file  . Stress: Not on file  Relationships  . Social Herbalist on phone: Not on file    Gets together: Not on file    Attends religious service: Not on file    Active member of club or organization: Not on file    Attends meetings of clubs or organizations: Not on file    Relationship status: Not on file  . Intimate partner violence    Fear of current or ex partner: Not on file    Emotionally abused: Not on file    Physically abused:  Not on file    Forced sexual activity: Not on file  Other Topics Concern  . Not on file  Social History Narrative  . Not on file    Family History  Problem Relation Age of Onset  . Pneumonia Mother      Review of Systems  Constitutional: Negative.  Negative for fever.  HENT: Negative.  Negative for congestion, hearing loss and sore throat.   Eyes: Negative.   Respiratory: Negative.  Negative for cough, hemoptysis, shortness of breath and wheezing.   Cardiovascular: Negative.  Negative for chest pain and palpitations.  Gastrointestinal: Negative.  Negative for abdominal pain, blood in stool, diarrhea, melena, nausea and vomiting.  Genitourinary: Negative.  Negative for hematuria.  Musculoskeletal: Negative.  Negative for back pain, myalgias and neck pain.  Skin: Negative.  Negative for rash.  Neurological: Negative.  Negative for dizziness and headaches.  All other systems reviewed and are negative.   Vitals:   02/28/19 1205  BP: 117/79  Pulse: 74  Resp: 16  Temp: 98 F (36.7 C)  SpO2: 97%    Physical Exam Vitals signs reviewed.  Constitutional:      Appearance:  He is obese.  HENT:     Head: Normocephalic.  Eyes:     Extraocular Movements: Extraocular movements intact.     Conjunctiva/sclera: Conjunctivae normal.     Pupils: Pupils are equal, round, and reactive to light.  Neck:     Musculoskeletal: Normal range of motion and neck supple. No muscular tenderness.  Cardiovascular:     Rate and Rhythm: Normal rate and regular rhythm.     Pulses: Normal pulses.     Heart sounds: Normal heart sounds.  Pulmonary:     Effort: Pulmonary effort is normal.     Breath sounds: Normal breath sounds.  Abdominal:     Palpations: Abdomen is soft.     Tenderness: There is no abdominal tenderness.     Hernia: A hernia ([place]) is present.  Musculoskeletal: Normal range of motion.  Lymphadenopathy:     Cervical: No cervical adenopathy.  Skin:    General: Skin is warm and  dry.     Capillary Refill: Capillary refill takes less than 2 seconds.  Neurological:     Mental Status: He is alert and oriented to person, place, and time.  Psychiatric:        Mood and Affect: Mood normal.        Behavior: Behavior normal.    Results for orders placed or performed in visit on 02/28/19 (from the past 24 hour(s))  POCT glucose (manual entry)     Status: None   Collection Time: 02/28/19 12:30 PM  Result Value Ref Range   POC Glucose 96 70 - 99 mg/dl  POCT glycosylated hemoglobin (Hb A1C)     Status: Abnormal   Collection Time: 02/28/19 12:36 PM  Result Value Ref Range   Hemoglobin A1C 5.8 (A) 4.0 - 5.6 %   HbA1c POC (<> result, manual entry)     HbA1c, POC (prediabetic range)     HbA1c, POC (controlled diabetic range)       ASSESSMENT & PLAN: Dustin Collins was seen today for referral.  Diagnoses and all orders for this visit:  Encounter for general adult medical examination with abnormal findings  Prediabetes -     POCT glucose (manual entry) -     POCT glycosylated hemoglobin (Hb A1C)  Screening for deficiency anemia -     CBC with Differential  Screening for lipoid disorders -     Lipid panel  Screening for endocrine, metabolic and immunity disorder -     Comprehensive metabolic panel  Umbilical hernia without obstruction and without gangrene -     Ambulatory referral to General Surgery  Body mass index (BMI) of 50-59.9 in adult (HCC) -     Amb ref to Medical Nutrition Therapy-MNT    Patient Instructions       If you have lab work done today you will be contacted with your lab results within the next 2 weeks.  If you have not heard from us then please contact us. The fastest way to get your results is to register for My Chart.   IF you received an x-ray today, you will [facility] an [facility]. Please contact [facility] at [phone] with questions or concerns regarding your invoice.   IF you received labwork  today, you will receive an invoice from [facility]. Please contact [facility] at [phone] with questions or concerns regarding your invoice.   Our billing staff will not be able to assist you with questions regarding bills from these companies.  You will be  contacted with the lab results as soon as they are available. The fastest way to get your results is to activate your My Chart account. Instructions are located on the last page of this paperwork. If you have not heard from [place] regarding the results in 2 weeks, please contact this office.      Health Maintenance, Male Adopting a healthy lifestyle and getting preventive care are important in promoting health and wellness. Ask your health care provider about:  The right schedule for you to have regular tests and exams.  Things you can do on your own to prevent diseases and keep yourself healthy. What should I know about diet, weight, and exercise? Eat a healthy diet   Eat a diet that includes plenty of vegetables, fruits, low-fat dairy products, and lean protein.  Do not eat a lot of foods that are high in solid fats, added sugars, or sodium. Maintain a healthy weight Body mass index (BMI) is a measurement that can be used to identify possible weight problems. It estimates body fat based on height and weight. Your health care provider can help determine your BMI and help you achieve or maintain a healthy weight. Get regular exercise Get regular exercise. This is one of the most important things you can do for your health. Most adults should:  Exercise for at least 150 minutes each week. The exercise should increase your heart rate and make you sweat (moderate-intensity exercise).  Do strengthening exercises at least twice a week. This is in addition to the moderate-intensity exercise.  Spend less time sitting. Even light physical activity can be beneficial. Watch cholesterol and blood lipids Have your blood tested for lipids and  cholesterol at 32 years of age, then have this test every 5 years. You may need to have your cholesterol levels checked more often if:  Your lipid or cholesterol levels are high.  You are older than 32 years of age.  You are at high risk for heart disease. What should I know about cancer screening? Many types of cancers can be detected early and may often be prevented. Depending on your health history and family history, you may need to have cancer screening at various ages. This may include screening for:  Colorectal cancer.  Prostate cancer.  Skin cancer.  Lung cancer. What should I know about heart disease, diabetes, and high blood pressure? Blood pressure and heart disease  High blood pressure causes heart disease and increases the risk of stroke. This is more likely to develop in people who have high blood pressure readings, are of African descent, or are overweight.  Talk with your health care provider about your target blood pressure readings.  Have your blood pressure checked: ? Every 3-5 years if you are 37-68 years of age. ? Every year if you are 39 years old or older.  If you are between the ages of 47 and 59 and are a current or former smoker, ask your health care provider if you should have a one-time screening for abdominal aortic aneurysm (AAA). Diabetes Have regular diabetes screenings. This checks your fasting blood sugar level. Have the screening done:  Once every three years after age 51 if you are at a normal weight and have a low risk for diabetes.  More often and at a younger age if you are overweight or have a high risk for diabetes. What should I know about preventing infection? Hepatitis B If you have a higher risk for hepatitis B, you should  be screened for this virus. Talk with your health care provider to find out if you are at risk for hepatitis B infection. Hepatitis C Blood testing is recommended for:  Everyone born from [birth year] through 1965.   Anyone with known risk factors for hepatitis C. Sexually transmitted infections (STIs)  You should be screened each year for STIs, including gonorrhea and chlamydia, if: ? You are sexually active and are younger than 32 years of age. ? You are older than 32 years of age and your health care provider tells you that you are at risk for this type of infection. ? Your sexual activity has changed since you were last screened, and you are at increased risk for chlamydia or gonorrhea. Ask your health care provider if you are at risk.  Ask your health care provider about whether you are at high risk for HIV. Your health care provider may recommend a prescription medicine to help prevent HIV infection. If you choose to take medicine to prevent HIV, you should first get tested for HIV. You should then be tested every 3 months for as long as you are taking the medicine. Follow these instructions at home: Lifestyle  Do not use any products that contain nicotine or tobacco, such as cigarettes, e-cigarettes, and chewing tobacco. If you need help quitting, ask your health care provider.  Do not use street drugs.  Do not share needles.  Ask your health care provider for help if you need support or information about quitting drugs. Alcohol use  Do not drink alcohol if your health care provider tells you not to drink.  If you drink alcohol: ? Limit how much you have to 0-2 drinks a day. ? Be aware of how much alcohol is in your drink. In the [place] one drink equals one 12 oz bottle of beer (355 mL), one 5 oz glass of wine (148 mL), or one 1 oz glass of hard liquor (44 mL). General instructions  Schedule regular health, dental, and eye exams.  Stay current with your vaccines.  Tell your health care provider if: ? You often feel depressed. ? You have ever been abused or do not feel safe at home. Summary  Adopting a healthy lifestyle and getting preventive care are important in promoting health and  wellness.  Follow your health care provider's instructions about healthy diet, exercising, and getting tested or screened for diseases.  Follow your health care provider's instructions on monitoring your cholesterol and blood pressure. This information is not intended to replace advice given to you by your health care provider. Make sure you discuss any questions you have with your health care provider. Document Released: 09/11/2007 Document Revised: 03/08/2018 Document Reviewed: 03/08/2018 Elsevier Patient Education  2020 Elsevier Inc.      Edwina Barth, MD [facility]

## 2019-02-28 NOTE — Patient Instructions (Addendum)
   If you have lab work done today you will be contacted with your lab results within the next 2 weeks.  If you have not heard from us then please contact us. The fastest way to get your results is to register for My Chart.   IF you received an x-ray today, you will receive an invoice from [facility]. Please contact [facility] at [phone] with questions or concerns regarding your invoice.   IF you received labwork today, you will receive an invoice from [facility]. Please contact [facility] at [phone] with questions or concerns regarding your invoice.   Our billing staff will not be able to assist you with questions regarding bills from these companies.  You will be contacted with the lab results as soon as they are available. The fastest way to get your results is to activate your My Chart account. Instructions are located on the last page of this paperwork. If you have not heard from us regarding the results in 2 weeks, please contact this office.     Health Maintenance, Male Adopting a healthy lifestyle and getting preventive care are important in promoting health and wellness. Ask your health care provider about:  The right schedule for you to have regular tests and exams.  Things you can do on your own to prevent diseases and keep yourself healthy. What should I know about diet, weight, and exercise? Eat a healthy diet   Eat a diet that includes plenty of vegetables, fruits, low-fat dairy products, and lean protein.  Do not eat a lot of foods that are high in solid fats, added sugars, or sodium. Maintain a healthy weight Body mass index (BMI) is a measurement that can be used to identify possible weight problems. It estimates body fat based on height and weight. Your health care provider can help determine your BMI and help you achieve or maintain a healthy weight. Get regular exercise Get regular exercise. This is one of the most important things you  can do for your health. Most adults should:  Exercise for at least 150 minutes each week. The exercise should increase your heart rate and make you sweat (moderate-intensity exercise).  Do strengthening exercises at least twice a week. This is in addition to the moderate-intensity exercise.  Spend less time sitting. Even light physical activity can be beneficial. Watch cholesterol and blood lipids Have your blood tested for lipids and cholesterol at 32 years of age, then have this test every 5 years. You may need to have your cholesterol levels checked more often if:  Your lipid or cholesterol levels are high.  You are older than 32 years of age.  You are at high risk for heart disease. What should I know about cancer screening? Many types of cancers can be detected early and may often be prevented. Depending on your health history and family history, you may need to have cancer screening at various ages. This may include screening for:  Colorectal cancer.  Prostate cancer.  Skin cancer.  Lung cancer. What should I know about heart disease, diabetes, and high blood pressure? Blood pressure and heart disease  High blood pressure causes heart disease and increases the risk of stroke. This is more likely to develop in people who have high blood pressure readings, are of African descent, or are overweight.  Talk with your health care provider about your target blood pressure readings.  Have your blood pressure checked: ? Every 3-5 years if you are 18-39 years   of age. ? Every year if you are 40 years old or older.  If you are between the ages of 65 and 75 and are a current or former smoker, ask your health care provider if you should have a one-time screening for abdominal aortic aneurysm (AAA). Diabetes Have regular diabetes screenings. This checks your fasting blood sugar level. Have the screening done:  Once every three years after age 45 if you are at a normal weight and have  a low risk for diabetes.  More often and at a younger age if you are overweight or have a high risk for diabetes. What should I know about preventing infection? Hepatitis B If you have a higher risk for hepatitis B, you should be screened for this virus. Talk with your health care provider to find out if you are at risk for hepatitis B infection. Hepatitis C Blood testing is recommended for:  Everyone born from 1945 through 1965.  Anyone with known risk factors for hepatitis C. Sexually transmitted infections (STIs)  You should be screened each year for STIs, including gonorrhea and chlamydia, if: ? You are sexually active and are younger than 32 years of age. ? You are older than 32 years of age and your health care provider tells you that you are at risk for this type of infection. ? Your sexual activity has changed since you were last screened, and you are at increased risk for chlamydia or gonorrhea. Ask your health care provider if you are at risk.  Ask your health care provider about whether you are at high risk for HIV. Your health care provider may recommend a prescription medicine to help prevent HIV infection. If you choose to take medicine to prevent HIV, you should first get tested for HIV. You should then be tested every 3 months for as long as you are taking the medicine. Follow these instructions at home: Lifestyle  Do not use any products that contain nicotine or tobacco, such as cigarettes, e-cigarettes, and chewing tobacco. If you need help quitting, ask your health care provider.  Do not use street drugs.  Do not share needles.  Ask your health care provider for help if you need support or information about quitting drugs. Alcohol use  Do not drink alcohol if your health care provider tells you not to drink.  If you drink alcohol: ? Limit how much you have to 0-2 drinks a day. ? Be aware of how much alcohol is in your drink. In the [place] one drink equals one 12  oz bottle of beer (355 mL), one 5 oz glass of wine (148 mL), or one 1 oz glass of hard liquor (44 mL). General instructions  Schedule regular health, dental, and eye exams.  Stay current with your vaccines.  Tell your health care provider if: ? You often feel depressed. ? You have ever been abused or do not feel safe at home. Summary  Adopting a healthy lifestyle and getting preventive care are important in promoting health and wellness.  Follow your health care provider's instructions about healthy diet, exercising, and getting tested or screened for diseases.  Follow your health care provider's instructions on monitoring your cholesterol and blood pressure. This information is not intended to replace advice given to you by your health care provider. Make sure you discuss any questions you have with your health care provider. Document Released: 09/11/2007 Document Revised: 03/08/2018 Document Reviewed: 03/08/2018 Elsevier Patient Education  2020 Elsevier Inc.  

## 2019-03-01 ENCOUNTER — Encounter: Payer: Self-pay | Admitting: Emergency Medicine

## 2019-03-01 LAB — COMPREHENSIVE METABOLIC PANEL
ALT: 20 IU/L (ref 0–44)
AST: 22 IU/L (ref 0–40)
Albumin/Globulin Ratio: 1.2 (ref 1.2–2.2)
Albumin: 4.2 g/dL (ref 4.0–5.0)
Alkaline Phosphatase: 97 IU/L (ref 39–117)
BUN/Creatinine Ratio: 11 (ref 9–20)
BUN: 10 mg/dL (ref 6–20)
Bilirubin Total: 0.3 mg/dL (ref 0.0–1.2)
CO2: 25 mmol/L (ref 20–29)
Calcium: 8.7 mg/dL (ref 8.7–10.2)
Chloride: 101 mmol/L (ref 96–106)
Creatinine, Ser: 0.95 mg/dL (ref 0.76–1.27)
GFR calc Af Amer: 122 mL/min/{1.73_m2} (ref >59)
GFR calc non Af Amer: 105 mL/min/{1.73_m2} (ref >59)
Globulin, Total: 3.5 g/dL (ref 1.5–4.5)
Glucose: 84 mg/dL (ref 65–99)
Potassium: 4.7 mmol/L (ref 3.5–5.2)
Sodium: 140 mmol/L (ref 134–144)
Total Protein: 7.7 g/dL (ref 6.0–8.5)

## 2019-03-01 LAB — CBC WITH DIFFERENTIAL/PLATELET
Basophils Absolute: 0.1 10*3/uL (ref 0.0–0.2)
Basos: 1 %
EOS (ABSOLUTE): 0.1 10*3/uL (ref 0.0–0.4)
Eos: 2 %
Hematocrit: 40.5 % (ref 37.5–51.0)
Hemoglobin: 13 g/dL (ref 13.0–17.7)
Immature Grans (Abs): 0 10*3/uL (ref 0.0–0.1)
Immature Granulocytes: 0 %
Lymphocytes Absolute: 3.1 10*3/uL (ref 0.7–3.1)
Lymphs: 35 %
MCH: 25.3 pg — ABNORMAL LOW (ref 26.6–33.0)
MCHC: 32.1 g/dL (ref 31.5–35.7)
MCV: 79 fL (ref 79–97)
Monocytes Absolute: 0.5 10*3/uL (ref 0.1–0.9)
Monocytes: 6 %
Neutrophils Absolute: 4.9 10*3/uL (ref 1.4–7.0)
Neutrophils: 56 %
Platelets: 373 10*3/uL (ref 150–450)
RBC: 5.14 x10E6/uL (ref 4.14–5.80)
RDW: 13.8 % (ref 11.6–15.4)
WBC: 8.8 10*3/uL (ref 3.4–10.8)

## 2019-03-01 LAB — LIPID PANEL
Chol/HDL Ratio: 4.3 ratio (ref 0.0–5.0)
Cholesterol, Total: 136 mg/dL (ref 100–199)
HDL: 32 mg/dL — ABNORMAL LOW (ref >39)
LDL Chol Calc (NIH): 90 mg/dL (ref 0–99)
Triglycerides: 66 mg/dL (ref 0–149)
VLDL Cholesterol Cal: 14 mg/dL (ref 5–40)

## 2019-03-08 ENCOUNTER — Encounter: Payer: BC Managed Care – PPO | Attending: Emergency Medicine | Admitting: Registered"

## 2019-03-08 ENCOUNTER — Encounter: Payer: Self-pay | Admitting: Registered"

## 2019-03-08 ENCOUNTER — Other Ambulatory Visit: Payer: Self-pay

## 2019-03-08 DIAGNOSIS — R7303 Prediabetes: Secondary | ICD-10-CM | POA: Diagnosis present

## 2019-03-08 NOTE — Progress Notes (Signed)
Medical Nutrition Therapy:  Appt start time: 1411 end time:  1515.   Assessment:  Primary concerns today: Dustin Collins referred due to Prediabetes.  Dustin Collins seeking RD guidance on the entirety of forming healthy lifestyle habits, including dietary choices.  Food Allergies/Intolerances: Suspected lactose intolerance. Dustin Collins reports that dairy makes him have to use the restroom quickly. Reports trying lactose free options (almond, soy) with no symptoms.   GI Concerns:  None reported  Pertinent Lab Values: 02/28/2019  A1c-5.8  HDL- 32   Weight Hx:  Preferred Learning Style:   No preference indicated   Learning Readiness:   Ready   MEDICATIONS: Dustin Collins reports taking a daily multivitamin   DIETARY INTAKE: Dustin Collins reports usually missing breakfast (5 days a week, usually weekdays), and having a late lunch and dinner. Snacking on and off.  Snack choices based on convenience. Dustin Collins reports bringing snacks to work from home more recently.    Usual eating pattern includes 2-3 meals and varying snacks per day.   Common foods: Rice (all types), Ground [place].  Avoided foods: Canned foods.    Typical Snacks: Fruits, crackers (Lance/cheddar).     Typical Beverages: 100% juices, minute maid juices, sometimes sodas, water (40-80 oz/day).  Location of Meals: At home, in the kitchen. Prefers to sit down and eat.  Electronics Present at [employer]: If alone, Yes (movies). Not when eating with wife.  Preferred/Accepted Foods:  Grains/Starches: Rice Proteins: [place] (products) Vegetables: Broccoli, carrots, cauliflower, asparagus Fruits: Grapes, strawberries, pineapples, cantaloupe,. Dairy: Milk (tolerates GI discomfort from lactose intolerance) Sauces/Dips/Spreads: Mrs. Dash Beverages: Fruit Juices, Minute maid, Nestle Splash water Other:  24-hr recall:   Woke up around 7  B (9:30 AM): blueberry muffin, 2 bottles of water Snk (11:30 AM):[place] bacon, egg, and cheese biscuit , hashbrown, large Pepsi   L ( 3 PM): Venti Starbucks caramel macchiato, Bacon egg and cheese croissant Snk ( PM): None reported D (7 PM): Ground [place], 2 slices of bread, mixed veggies (cabbage, onions, etc), Minutemaid blueberry lemonade juice. Snk (10-11:00 PM): Cheddar popcorn. Beverages: water, Pepsi, Juice  Usual physical activity: Lifting weights, walking, some running, jumping rope, sports(formerly played football) in general. 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 Dustin Collins reports he attempted to exercise vigorously recently, and felt as if he overexerted himself.    Progress Towards Goal(s):  In progress.   Nutritional Diagnosis:  NB-1.7 Undesireable food choices As related to Knowledge deficit.  As evidenced by Clients desire to learn proper dietary composition for healthy nutrition..    Intervention:  Nutrition Counseling. Dietetic Intern Hewitt Blade) provided counseling for appointment under supervision of dietitian. Educated patient on importance of moderation, and the difference between labeling foods as good or bad, as opposed to items we can have in more or less moderation. Counseled patient on various ways to still include favorite foods and beverages, while making healthier choices. Highlight patient's current physical activity level (walks a lot for work), and reinforce self efficacy in moving his body. Educated patient on importance of health, not weight. Included elaborating on the numerous biomarkers he had within the normal ranges. Educated patient on attempting to make half of his meals be non-starchy vegetables, one quarter starches, and one quarter protein. Dustin Collins appeared agreeable to information/goals discussed.   Goals:   Try to drink half juice, half water instead of pure juice.  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  Eat a variety of fruits!  Continue to take the stairs to class. Try to do it once more per week.  Try to get in breakfast once more a week.  Go with your wife to exercise.  Make sure to get in three meals per day. Try to have balanced meals like the My Plate example (see handout). Include lean proteins, vegetables, fruits, and whole grains at meals.   Teaching Method Utilized:  Visual Auditory Hands on  Handouts given during visit include:  Diabetic Plate  Food List  Barriers to learning/adherence to lifestyle change: None identified  Demonstrated degree of understanding via:  Teach Back   Monitoring/Evaluation:  Dietary intake, exercise, and body weight in 1 month(s).

## 2019-03-08 NOTE — Patient Instructions (Addendum)
Goals:   Try to drink half juice, half water instead of pure juice.  Try Fairlife, or Lactaid milk products, and assess GI tolerance  Gr[place] at [place] instead of a Venti  Choose to have breakfast within an hour of waking.  Eat a variety of fruits!  Continue to take the stairs to class. Try to do it once more per week.  Try to get in breakfast once more a week.  Go with your wife to exercise.  Make sure to get in three meals per day. Try to have balanced meals like the My Plate example (see handout). Include lean proteins, vegetables, fruits, and whole grains at meals.

## 2019-04-23 ENCOUNTER — Ambulatory Visit: Payer: BC Managed Care – PPO | Admitting: Registered"

## 2019-06-08 ENCOUNTER — Ambulatory Visit: Payer: Self-pay

## 2019-06-11 ENCOUNTER — Other Ambulatory Visit: Payer: Self-pay | Admitting: Surgery

## 2019-06-11 DIAGNOSIS — K439 Ventral hernia without obstruction or gangrene: Secondary | ICD-10-CM

## 2019-06-26 ENCOUNTER — Ambulatory Visit
Admission: RE | Admit: 2019-06-26 | Discharge: 2019-06-26 | Disposition: A | Discharge status: Home / Self Care | Payer: BC Managed Care – PPO | Source: Ambulatory Visit | Attending: Surgery | Admitting: Surgery

## 2019-06-26 ENCOUNTER — Other Ambulatory Visit: Payer: Self-pay

## 2019-06-26 DIAGNOSIS — K439 Ventral hernia without obstruction or gangrene: Secondary | ICD-10-CM

## 2019-06-26 MED ORDER — IOPAMIDOL (ISOVUE-300) INJECTION 61%
125.0000 mL | Freq: Once | INTRAVENOUS | Status: AC | PRN
Start: 2019-06-26 — End: 2019-06-26
  Administered 2019-06-26: 125 mL via INTRAVENOUS

## 2019-08-13 ENCOUNTER — Encounter: Payer: Self-pay | Admitting: Emergency Medicine

## 2019-10-29 ENCOUNTER — Ambulatory Visit
Admission: RE | Admit: 2019-10-29 | Discharge: 2019-10-29 | Disposition: A | Discharge status: Home / Self Care | Payer: BC Managed Care – PPO | Source: Ambulatory Visit | Attending: Emergency Medicine | Admitting: Emergency Medicine

## 2019-10-29 ENCOUNTER — Other Ambulatory Visit: Payer: Self-pay

## 2019-10-29 VITALS — BP 143/83 | HR 82 | Temp 97.6°F | Resp 16

## 2019-10-29 DIAGNOSIS — J020 Streptococcal pharyngitis: Secondary | ICD-10-CM | POA: Diagnosis not present

## 2019-10-29 LAB — POCT RAPID STREP A (OFFICE): Rapid Strep A Screen: POSITIVE — AB

## 2019-10-29 MED ORDER — PENICILLIN G BENZATHINE 1200000 UNIT/2ML IM SUSP
1.2000 10*6.[IU] | Freq: Once | INTRAMUSCULAR | Status: AC
  Administered 2019-10-29: 1.2 10*6.[IU] via INTRAMUSCULAR

## 2019-10-29 NOTE — ED Provider Notes (Signed)
Dustin Collins    CSN: [id number] Arrival date & time: 10/29/19  6073      History   Chief Complaint Chief Complaint  Patient presents with  . Nasal Congestion  . Otalgia  . Sore Throat    HPI Dustin Collins is a 34 y.o. male.  Patient presents with sore throat, earache, nasal congestion x1 day. He states it is painful to swallow but he is able to eat and drink. He denies fever, chills, rash, arthralgias, shortness of breath, vomiting, diarrhea, or other symptoms. No treatment attempted at home.  The history is provided by the patient.    Past Medical History:  Diagnosis Date  . Testicular torsion    34 y/o    There are no problems to display for this patient.   Past Surgical History:  Procedure Laterality Date  . APPENDECTOMY    . TONSILLECTOMY         Home Medications    Prior to Admission medications   Medication Sig Start Date End Date Taking? Authorizing Provider  Multiple Vitamin (MULTIVITAMIN) capsule Take 1 capsule by mouth daily.    [provider]    Family History Family History  Problem Relation Age of Onset  . Pneumonia Mother     Social History Social History   Tobacco Use  . Smoking status: Never Smoker  . Smokeless tobacco: Never Used  Vaping Use  . Vaping Use: Never used  Substance Use Topics  . Alcohol use: No  . Drug use: No     Allergies   Patient has no known allergies.   Review of Systems Review of Systems  Constitutional: Negative for chills and fever.  HENT: Positive for congestion, ear pain and sore throat.   Eyes: Negative for pain and visual disturbance.  Respiratory: Negative for cough and shortness of breath.   Cardiovascular: Negative for chest pain and palpitations.  Gastrointestinal: Negative for abdominal pain, diarrhea and vomiting.  Genitourinary: Negative for dysuria and hematuria.  Musculoskeletal: Negative for arthralgias and back pain.  Skin: Negative for color change and rash.    Neurological: Negative for seizures and syncope.  All other systems reviewed and are negative.    Physical Exam Triage Vital Signs ED Triage Vitals  Enc Vitals Group     BP      Pulse      Resp      Temp      Temp src      SpO2      Weight      Height      Head Circumference      Peak Flow      Pain Score      Pain Loc      Pain Edu?      Excl. in GC?    No data found.  Updated Vital Signs BP (!) 143/83 (BP Location: Right Arm)   Pulse 82   Temp 97.6 F (36.4 C)   Resp 16   SpO2 95%   Visual Acuity Right Eye Distance:   Left Eye Distance:   Bilateral Distance:    Right Eye Near:   Left Eye Near:    Bilateral Near:     Physical Exam Vitals and nursing note reviewed.  Constitutional:      General: He is not in acute distress.    Appearance: He is well-developed.  HENT:     Head: Normocephalic and atraumatic.     Right Ear: Tympanic membrane normal.  Left Ear: Tympanic membrane normal.     Mouth/Throat:     Mouth: Mucous membranes are moist.     Pharynx: Posterior oropharyngeal erythema present.  Eyes:     Conjunctiva/sclera: Conjunctivae normal.  Cardiovascular:     Rate and Rhythm: Normal rate and regular rhythm.     Heart sounds: No murmur heard.   Pulmonary:     Effort: Pulmonary effort is normal. No respiratory distress.     Breath sounds: Normal breath sounds.  Abdominal:     Palpations: Abdomen is soft.     Tenderness: There is no abdominal tenderness. There is no guarding or rebound.  Musculoskeletal:     Cervical back: Neck supple.  Skin:    General: Skin is warm and dry.     Findings: No rash.  Neurological:     General: No focal deficit present.     Mental Status: He is alert and oriented to person, place, and time.     Gait: Gait normal.  Psychiatric:        Mood and Affect: Mood normal.        Behavior: Behavior normal.      UC Treatments / Results  Labs (all labs ordered are listed, but only abnormal results are  displayed) Labs Reviewed  POCT RAPID STREP A (OFFICE) - Abnormal; Notable for the following components:      Result Value   Rapid Strep A Screen Positive (*)    All other components within normal limits  NOVEL CORONAVIRUS, NAA    EKG   Radiology No results found.  Procedures Procedures (including critical care time)  Medications Ordered in UC Medications  penicillin g benzathine (BICILLIN LA) 1200000 UNIT/2ML injection 1.2 Million Units (1.2 Million Units Intramuscular Given 10/29/19 1048)    Initial Impression / Assessment and Plan / UC Course  I have reviewed the triage vital signs and the nursing notes.  Pertinent labs & imaging results that were available during my care of the patient were reviewed by me and considered in my medical decision making (see chart for details).   Strep throat. Treated with Bicillin LA. Instructed patient to take Tylenol or ibuprofen as needed for fever or discomfort. PCR COVID pending; instructed patient to self quarantine until the test result is back. Instructed him to follow-up with his PCP if his symptoms are not improving. Patient agrees to plan of care.     Final Clinical Impressions(s) / UC Diagnoses   Final diagnoses:  Streptococcal sore throat     Discharge Instructions     You have strep throat.    You were given an injection of penicillin. There is no need for additional antibiotics at this time.    Take Tylenol or ibuprofen as needed for fever or discomfort.    Your Covid test is pending. You should self quarantine until the test result is back.    Follow up with your primary care provider if your symptoms are not improving.       ED Prescriptions    None     PDMP not reviewed this encounter.   Mickie Bail, NP 10/29/19 1049

## 2019-10-29 NOTE — Discharge Instructions (Signed)
You have strep throat.    You were given an injection of penicillin. There is no need for additional antibiotics at this time.    Take Tylenol or ibuprofen as needed for fever or discomfort.    Your Covid test is pending. You should self quarantine until the test result is back.    Follow up with your primary care provider if your symptoms are not improving.

## 2019-10-29 NOTE — ED Triage Notes (Signed)
Pt presents to UC for nasal congestion, left ear ache, and left sided throat pain. Pt states he feels like left face and throat are swollen. Pt states swallowing is painful. Pt states symptoms have been present since last night. Pt denies fevers. Pt denies OTC treatments or relieving factors.

## 2019-10-30 LAB — SARS-COV-2, NAA 2 DAY TAT

## 2019-10-30 LAB — NOVEL CORONAVIRUS, NAA: SARS-CoV-2, NAA: NOT DETECTED

## 2019-11-02 ENCOUNTER — Ambulatory Visit
Admission: EM | Admit: 2019-11-02 | Discharge: 2019-11-02 | Disposition: A | Discharge status: Home / Self Care | Payer: BC Managed Care – PPO | Attending: Emergency Medicine | Admitting: Emergency Medicine

## 2019-11-02 DIAGNOSIS — R110 Nausea: Secondary | ICD-10-CM | POA: Diagnosis not present

## 2019-11-02 DIAGNOSIS — R42 Dizziness and giddiness: Secondary | ICD-10-CM

## 2019-11-02 DIAGNOSIS — H9202 Otalgia, left ear: Secondary | ICD-10-CM | POA: Diagnosis not present

## 2019-11-02 DIAGNOSIS — K0889 Other specified disorders of teeth and supporting structures: Secondary | ICD-10-CM

## 2019-11-02 MED ORDER — IBUPROFEN 800 MG PO TABS: 800.0000 mg | ORAL_TABLET | Freq: Three times a day (TID) | ORAL | 0 refills | Status: DC | PRN

## 2019-11-02 MED ORDER — ONDANSETRON HCL 4 MG PO TABS: 4.0000 mg | ORAL_TABLET | Freq: Four times a day (QID) | ORAL | 0 refills | Status: DC | PRN

## 2019-11-02 NOTE — ED Triage Notes (Signed)
Patient complains of left ear pain, dental pain, and dizziness x4 days.

## 2019-11-02 NOTE — ED Provider Notes (Signed)
Dustin Collins    CSN: [id number] Arrival date & time: 11/02/19  4270      History   Chief Complaint Chief Complaint  Patient presents with  . Otalgia  . Dental Pain  . Dizziness    HPI Dustin Collins is a 34 y.o. male.   Patient presents with 4-day history of left ear pain, left upper tooth ache, dizziness, nausea.  He was seen here on 10/29/2019; diagnosed with strep throat; treated with Bicillin LA; negative for COVID.  He denies focal weakness, dizziness, fever, chills, rash, chest pain, cough, shortness of breath, vomiting, diarrhea, or other symptoms.  The history is provided by the patient.    Past Medical History:  Diagnosis Date  . Testicular torsion    34 y/o    There are no problems to display for this patient.   Past Surgical History:  Procedure Laterality Date  . APPENDECTOMY    . TONSILLECTOMY         Home Medications    Prior to Admission medications   Medication Sig Start Date End Date Taking? Authorizing Provider  ibuprofen (ADVIL) 800 MG tablet Take 1 tablet (800 mg total) by mouth every 8 (eight) hours as needed. 11/02/19   Mickie Bail, NP  Multiple Vitamin (MULTIVITAMIN) capsule Take 1 capsule by mouth daily.    [provider]  ondansetron (ZOFRAN) 4 MG tablet Take 1 tablet (4 mg total) by mouth every 6 (six) hours as needed for nausea or vomiting. 11/02/19   Mickie Bail, NP    Family History Family History  Problem Relation Age of Onset  . Pneumonia Mother     Social History Social History   Tobacco Use  . Smoking status: Never Smoker  . Smokeless tobacco: Never Used  Vaping Use  . Vaping Use: Never used  Substance Use Topics  . Alcohol use: No  . Drug use: No     Allergies   Patient has no known allergies.   Review of Systems Review of Systems  Constitutional: Negative for chills and fever.  HENT: Positive for dental problem and ear pain. Negative for sore throat.   Eyes: Negative for pain and visual  disturbance.  Respiratory: Negative for cough and shortness of breath.   Cardiovascular: Negative for chest pain and palpitations.  Gastrointestinal: Positive for nausea. Negative for abdominal pain, diarrhea and vomiting.  Genitourinary: Negative for dysuria and hematuria.  Musculoskeletal: Negative for arthralgias and back pain.  Skin: Negative for color change and rash.  Neurological: Positive for dizziness. Negative for seizures, syncope, facial asymmetry, speech difficulty, weakness, numbness and headaches.  All other systems reviewed and are negative.    Physical Exam Triage Vital Signs ED Triage Vitals  Enc Vitals Group     BP      Pulse      Resp      Temp      Temp src      SpO2      Weight      Height      Head Circumference      Peak Flow      Pain Score      Pain Loc      Pain Edu?      Excl. in GC?    No data found.  Updated Vital Signs BP 126/83   Pulse 79   Temp 98.6 F (37 C)   Resp 20   SpO2 95%   Visual Acuity Right Eye  Distance:   Left Eye Distance:   Bilateral Distance:    Right Eye Near:   Left Eye Near:    Bilateral Near:     Physical Exam Vitals and nursing note reviewed.  Constitutional:      General: He is not in acute distress.    Appearance: He is well-developed. He is obese. He is not ill-appearing.  HENT:     Head: Normocephalic and atraumatic.     Right Ear: Tympanic membrane and ear canal normal.     Left Ear: Tympanic membrane and ear canal normal.     Nose: Nose normal.     Mouth/Throat:     Mouth: Mucous membranes are moist.     Pharynx: Oropharynx is clear.   Eyes:     Conjunctiva/sclera: Conjunctivae normal.  Cardiovascular:     Rate and Rhythm: Normal rate and regular rhythm.     Heart sounds: No murmur heard.   Pulmonary:     Effort: Pulmonary effort is normal. No respiratory distress.     Breath sounds: Normal breath sounds. No wheezing or rhonchi.  Abdominal:     Palpations: Abdomen is soft.      Tenderness: There is no abdominal tenderness. There is no guarding or rebound.  Musculoskeletal:     Cervical back: Neck supple.  Skin:    General: Skin is warm and dry.     Findings: No rash.  Neurological:     General: No focal deficit present.     Mental Status: He is alert and oriented to person, place, and time.     Sensory: No sensory deficit.     Motor: No weakness.     Gait: Gait normal.  Psychiatric:        Mood and Affect: Mood normal.        Behavior: Behavior normal.      UC Treatments / Results  Labs (all labs ordered are listed, but only abnormal results are displayed) Labs Reviewed - No data to display  EKG   Radiology No results found.  Procedures Procedures (including critical care time)  Medications Ordered in UC Medications - No data to display  Initial Impression / Assessment and Plan / UC Course  I have reviewed the triage vital signs and the nursing notes.  Pertinent labs & imaging results that were available during my care of the patient were reviewed by me and considered in my medical decision making (see chart for details).   Dental pain, left otalgia, dizziness, nausea.  Treating dental pain with ibuprofen.  Treating nausea with Zofran.  Discussed safety with dizziness and handout provided.  Handouts also provided on dental pain, earache, nausea.  Dental resource guide provided and patient instructed to make the soonest available appointment with a dentist.  Instructed patient to follow-up with his PCP if his symptoms are not improving.  Patient agrees to plan of care.     Final Clinical Impressions(s) / UC Diagnoses   Final diagnoses:  Pain, dental  Otalgia of left ear  Dizziness  Nausea without vomiting     Discharge Instructions     Take the ibuprofen as needed for discomfort.  Take the Zofran as needed for nausea.    See the attached handouts about dental pain, earache, dizziness, and nausea.   A dental resource guide is  attached.  Please call to make an appointment with a dentist as soon as possible.    Follow up with your primary care provider if your  symptoms are not improving.         ED Prescriptions    Medication Sig Dispense Auth. Provider   ibuprofen (ADVIL) 800 MG tablet Take 1 tablet (800 mg total) by mouth every 8 (eight) hours as needed. 21 tablet Mickie Bail, NP   ondansetron (ZOFRAN) 4 MG tablet Take 1 tablet (4 mg total) by mouth every 6 (six) hours as needed for nausea or vomiting. 12 tablet Mickie Bail, NP     I have reviewed the PDMP during this encounter.   Mickie Bail, NP 11/02/19 [phone]

## 2019-11-02 NOTE — Discharge Instructions (Addendum)
Take the ibuprofen as needed for discomfort.  Take the Zofran as needed for nausea.    See the attached handouts about dental pain, earache, dizziness, and nausea.   A dental resource guide is attached.  Please call to make an appointment with a dentist as soon as possible.    Follow up with your primary care provider if your symptoms are not improving.

## 2020-08-18 IMAGING — CT CT ABD-PELV W/ CM
1 of 2 series · 14 of 32 positions shown, 19 images · IV contrast (APPLIED)
Comparison: None.

CLINICAL DATA: Central abdominal pain.

EXAM:
CT ABDOMEN AND PELVIS WITH CONTRAST
TECHNIQUE: Multidetector CT imaging of the abdomen and pelvis was performed
using the standard protocol following bolus administration of
intravenous contrast.
CONTRAST:  125mL JCQ9Q6-TJJ IOPAMIDOL (JCQ9Q6-TJJ) INJECTION 61%

[Series 2: abd/pelvis w/cm · axial · 1.27mm/px · z∈[-583,-153]mm · 14 of 98 slices shown, 19 images]
[im 6/98  soft-tissue]
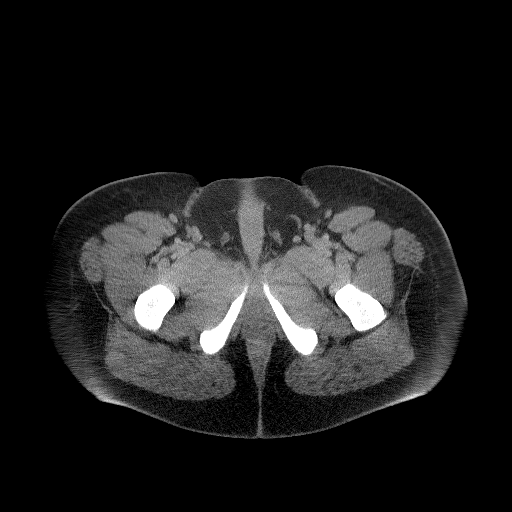
[im 6/98  bone]
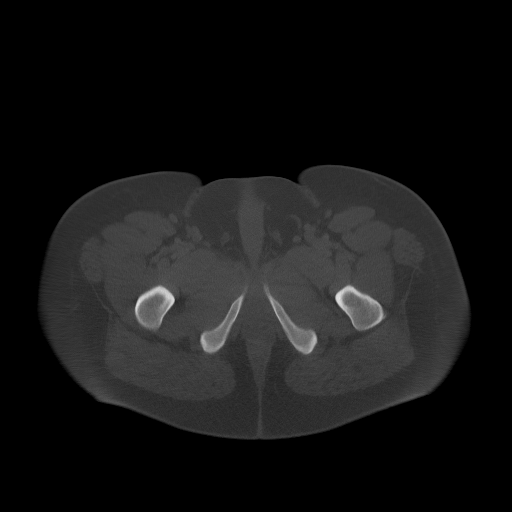
[im 16/98  soft-tissue]
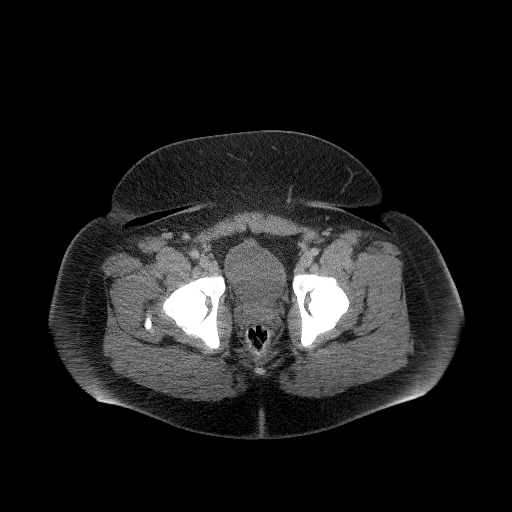
[im 21/98  soft-tissue]
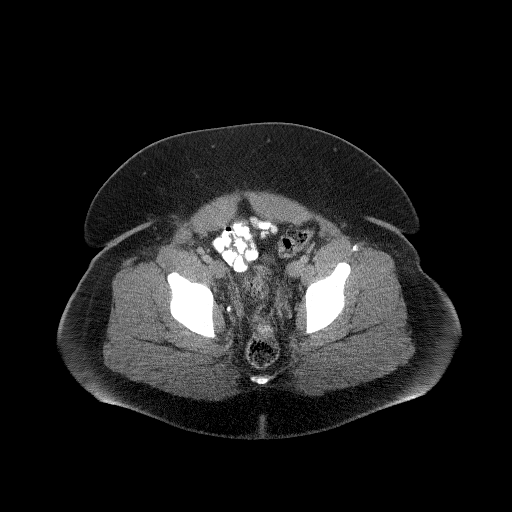
[im 26/98  soft-tissue]
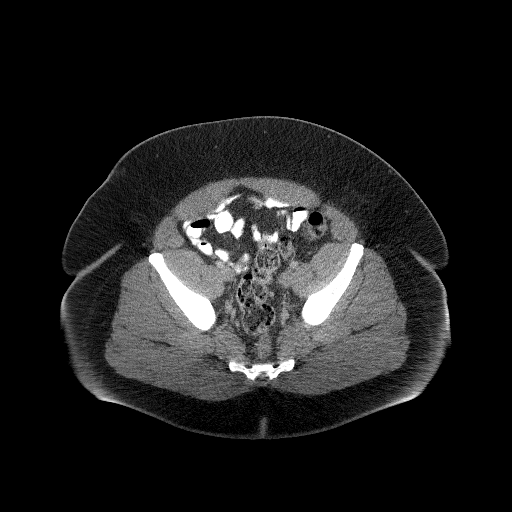
[im 36/98  soft-tissue]
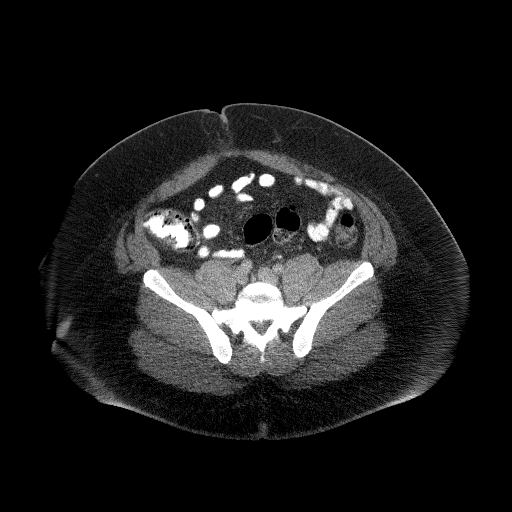
[im 41/98  soft-tissue]
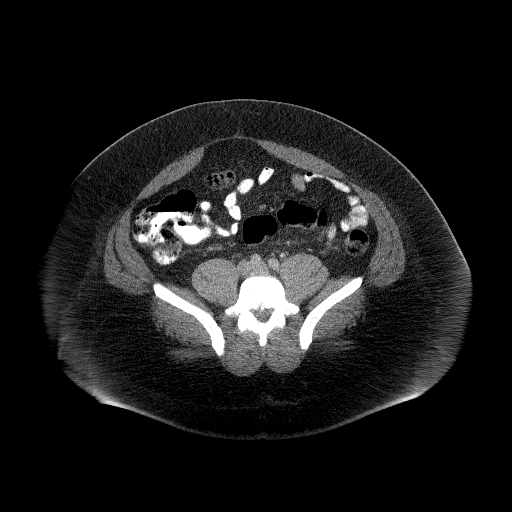
[im 52/98  soft-tissue]
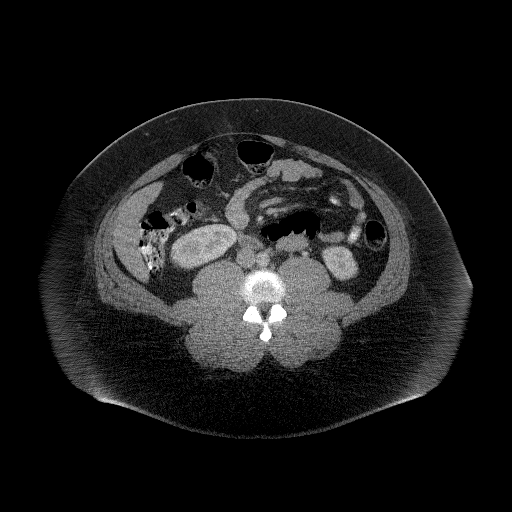
[im 57/98  soft-tissue]
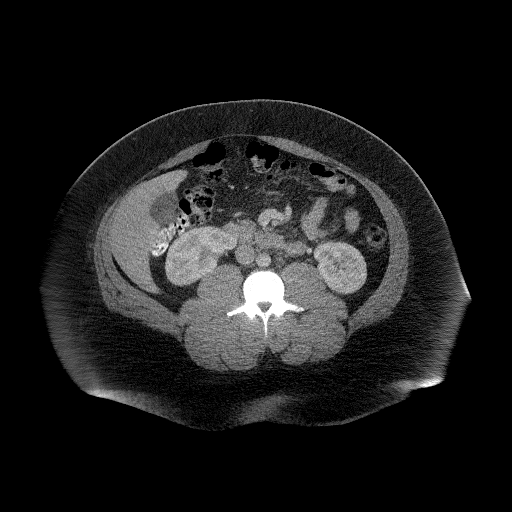
[im 62/98  soft-tissue]
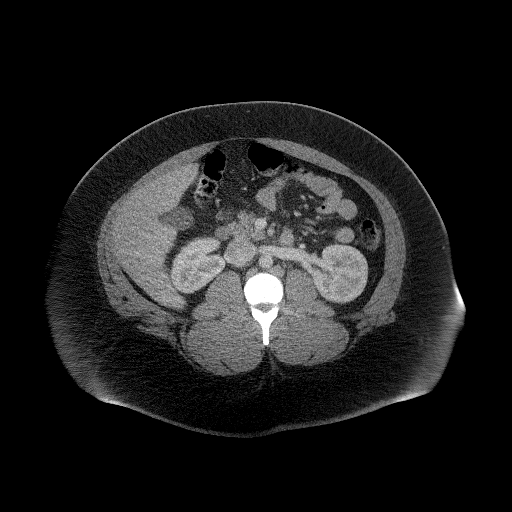
[im 62/98  bone]
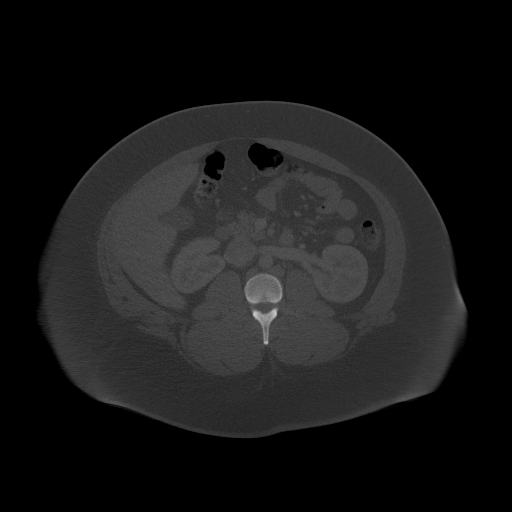
[im 72/98  soft-tissue]
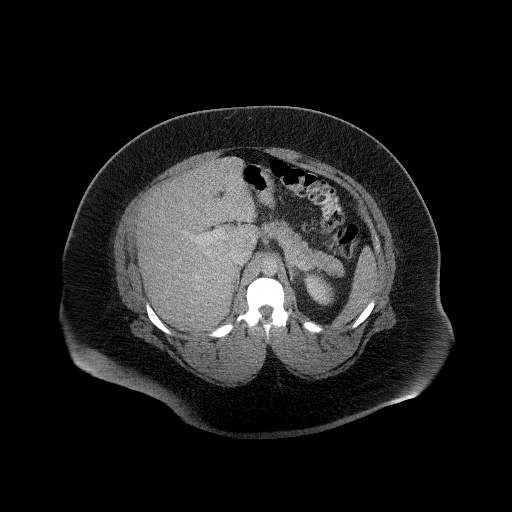
[im 77/98  soft-tissue]
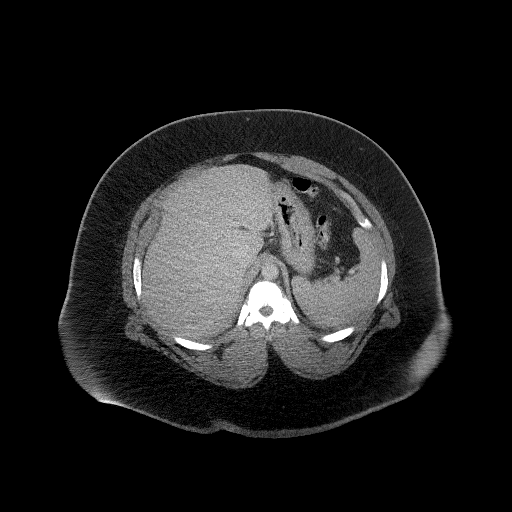
[im 77/98  lung]
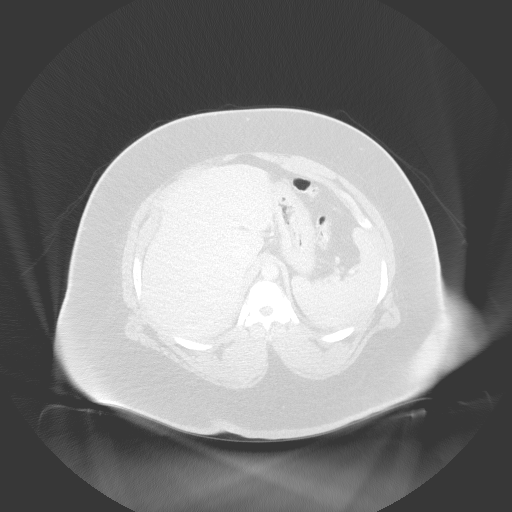
[im 82/98  soft-tissue]
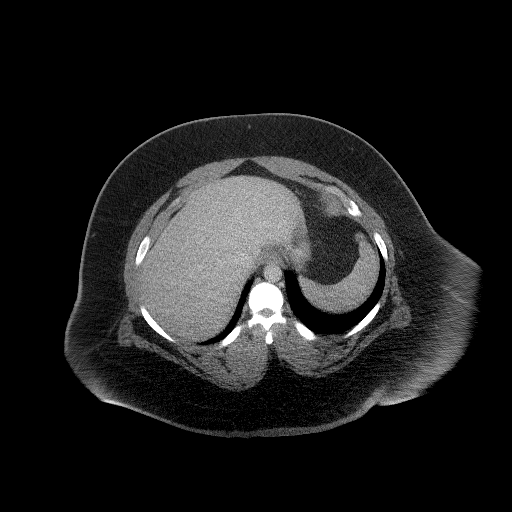
[im 82/98  lung]
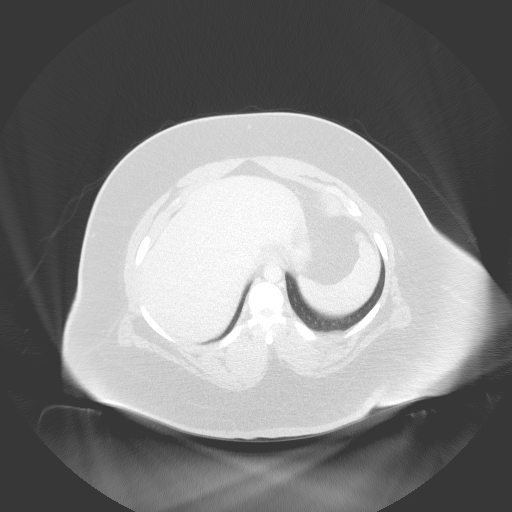
[im 87/98  lung]
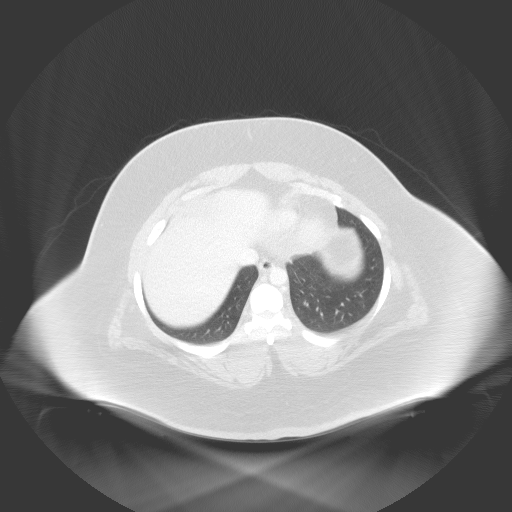
[im 92/98  soft-tissue]
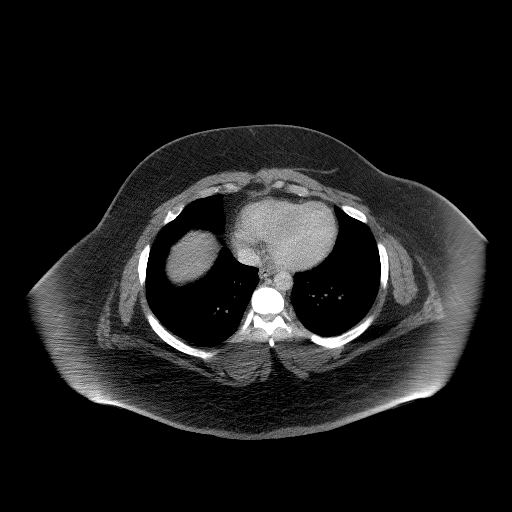
[im 92/98  lung]
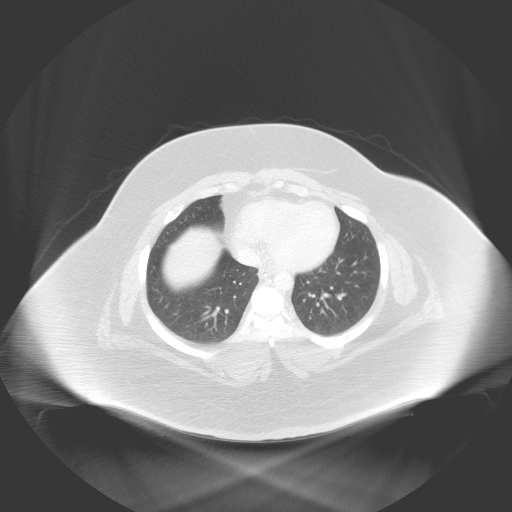

[14 of 32 positions shown; findings below may reference images not displayed]

FINDINGS: Lower chest: No acute abnormality.

Hepatobiliary: No focal liver abnormality is seen. No gallstones,
gallbladder wall thickening, or biliary dilatation.

Pancreas: Unremarkable. No pancreatic ductal dilatation or
surrounding inflammatory changes.

Spleen: Normal in size without focal abnormality.

Adrenals/Urinary Tract: Adrenal glands are unremarkable. Kidneys are
normal, without renal calculi, focal lesion, or hydronephrosis.
Bladder is unremarkable.

Stomach/Bowel: Stomach is within normal limits. No evidence of bowel
wall thickening, distention, or inflammatory changes.

Vascular/Lymphatic: No significant vascular findings are present. No
enlarged abdominal or pelvic lymph nodes.

Reproductive: Prostate is unremarkable.

Other: Small fat containing umbilical hernia. Small fat containing
supraumbilical midline hernia. No ascites.

Musculoskeletal: No acute or significant osseous findings.
IMPRESSION: 1. No acute abdominal or pelvic pathology.
2. Small fat containing umbilical hernia. Small fat containing
supraumbilical midline hernia.

## 2023-03-01 NOTE — Progress Notes (Unsigned)
New patient visit   Patient: Dustin Collins   DOB: 1985/04/29   37 y.o. Male  MRN: [id number] Visit Date: 03/02/2023  Today's healthcare provider: Ronnald Ramp, MD   No chief complaint on file.  Subjective    Dustin Collins is a 37 y.o. male who presents today as a new patient to establish care.      Discussed the use of AI scribe software for clinical note transcription with the patient, who gave verbal consent to proceed.  History of Present Illness              Past Medical History:  Diagnosis Date   Testicular torsion    37 y/o    Outpatient Medications Prior to Visit  Medication Sig   ibuprofen (ADVIL) 800 MG tablet Take 1 tablet (800 mg total) by mouth every 8 (eight) hours as needed.   Multiple Vitamin (MULTIVITAMIN) capsule Take 1 capsule by mouth daily.   ondansetron (ZOFRAN) 4 MG tablet Take 1 tablet (4 mg total) by mouth every 6 (six) hours as needed for nausea or vomiting.   No facility-administered medications prior to visit.    Past Surgical History:  Procedure Laterality Date   APPENDECTOMY     TONSILLECTOMY     Family Status  Relation Name Status   Mother  Deceased   Father  Deceased   MGM  Deceased   MGF  Deceased   PGM  Deceased   PGF  Deceased  No partnership data on file   Family History  Problem Relation Age of Onset   Pneumonia Mother    Social History   Socioeconomic History   Marital status: Married    Spouse name: [place]   Number of children: 0   Years of education: Not on file   Highest education level: Master's degree (e.g., MA, MS, MEng, MEd, MSW, MBA)  Occupational History   Not on file  Tobacco Use   Smoking status: Never   Smokeless tobacco: Never  Vaping Use   Vaping status: Never Used  Substance and Sexual Activity   Alcohol use: No   Drug use: No   Sexual activity: Yes  Other Topics Concern   Not on file  Social History Narrative   Not on file   Social Determinants of Health   Financial  Resource Strain: Low Risk  (02/28/2023)   Overall Financial Resource Strain (CARDIA)    Difficulty of Paying Living Expenses: Not hard at all  Food Insecurity: No Food Insecurity (02/28/2023)   Hunger Vital Sign    Worried About Running Out of Food in the Last Year: Never true    Ran Out of Food in the Last Year: Never true  Transportation Needs: No Transportation Needs (02/28/2023)   PRAPARE - Administrator, Civil Service (Medical): No    Lack of Transportation (Non-Medical): No  Physical Activity: Insufficiently Active (02/28/2023)   Exercise Vital Sign    Days of Exercise per Week: 3 days    Minutes of Exercise per Session: 20 min  Stress: No Stress Concern Present (02/28/2023)   [employer] of Occupational Health - Occupational Stress Questionnaire    Feeling of Stress : Not at all  Social Connections: Moderately Integrated (02/28/2023)   Social Connection and Isolation Panel [NHANES]    Frequency of Communication with Friends and Family: More than three times a week    Frequency of Social Gatherings with Friends and Family: Once a week    Attends  Religious Services: More than 4 times per year    Active Member of Clubs or Organizations: No    Attends Banker Meetings: Not on file    Marital Status: Married     No Known Allergies  Immunization History  Administered Date(s) Administered   PFIZER Comirnaty(Gray Top)Covid-19 Tri-Sucrose Vaccine 04/10/2020    Health Maintenance  Topic Date Due   Hepatitis C Screening  Never done   DTaP/Tdap/Td (1 - Tdap) Never done   INFLUENZA VACCINE  Never done   COVID-19 Vaccine (4 - 2023-24 season) 11/28/2022   HIV Screening  Completed   HPV VACCINES  Aged Out    Patient Care Team: [facility] as PCP - General  Review of Systems  {Insert previous labs (optional):23779} {See past labs  Heme  Chem  Endocrine  Serology  Results Review (optional):1}   Objective    There were no vitals  taken for this visit. {Insert last BP/Wt (optional):23777}{See vitals history (optional):1}    Depression Screen    03/08/2019    2:21 PM 02/28/2019   12:07 PM 06/01/2017    2:11 PM  PHQ 2/9 Scores  PHQ - 2 Score 0 0 0   No results found for any visits on 03/02/23.   Physical Exam ***    Assessment & Plan      Problem List Items Addressed This Visit   None   Assessment and Plan               No follow-ups on file.      Ronnald Ramp, MD  [facility] [phone] (phone) [phone] (fax)  [facility]

## 2023-03-02 ENCOUNTER — Ambulatory Visit: Payer: BC Managed Care – PPO | Admitting: Family Medicine

## 2023-03-02 ENCOUNTER — Encounter: Payer: Self-pay | Admitting: Family Medicine

## 2023-03-02 DIAGNOSIS — R030 Elevated blood-pressure reading, without diagnosis of hypertension: Secondary | ICD-10-CM | POA: Diagnosis not present

## 2023-03-02 DIAGNOSIS — Z7689 Persons encountering health services in other specified circumstances: Secondary | ICD-10-CM

## 2023-03-02 DIAGNOSIS — Z862 Personal history of diseases of the blood and blood-forming organs and certain disorders involving the immune mechanism: Secondary | ICD-10-CM

## 2023-03-02 DIAGNOSIS — R7303 Prediabetes: Secondary | ICD-10-CM | POA: Diagnosis not present

## 2023-03-02 NOTE — Patient Instructions (Signed)
VISIT SUMMARY:  Mr. Dustin Collins, a 37 year old individual, visited to establish care. He is currently preparing for bariatric surgery and has no significant past medical history. He reported slightly elevated blood pressure and mild anemia, and is hopeful that the surgery will help with his prediabetes. He is also planning to have an umbilical hernia repaired during the surgery.  YOUR PLAN:  -OBESITY: Obesity is a condition characterized by excessive body fat. You are scheduled for bariatric surgery on March 08, 2023, which is expected to help with weight loss and improve conditions like prediabetes and hypertension. Please continue taking your prescribed supplements, including a multivitamin with iron, and schedule a follow-up visit in February or March to assess your recovery and adherence to supplements.  -UMBILICAL HERNIA: An umbilical hernia occurs when part of the intestine protrudes through an opening in the abdominal muscles. This will be repaired during your upcoming bariatric surgery.  -PREDIABETES: Prediabetes is a condition where blood sugar levels are higher than normal but not high enough to be classified as diabetes. Your A1c was 6.3%, indicating prediabetes. The upcoming bariatric surgery is expected to help reverse this condition. We will monitor your A1c levels post-surgery.  -ELEVATED BLOOD PRESSURE: Elevated blood pressure, or hypertension, is when the force of the blood against your artery walls is too high. Your blood pressure was elevated during the visit, likely due to rushing. Please monitor your blood pressure at home, aiming for readings under 130/80, and ensure you use an appropriate-sized cuff and take measurements in a calm setting.  -MILD ANEMIA: Mild anemia is a condition where you don't have enough healthy red blood cells to carry adequate oxygen to your body's tissues. Your hemoglobin was slightly low, suggesting possible iron deficiency. Continue taking your iron  supplement and we will monitor your hemoglobin and MCV levels post-surgery.  -GENERAL HEALTH MAINTENANCE: It is important to have regular physical exams and preventive health measures. Your last annual physical was in May 2023. Please schedule your next annual physical in March or April 2024, and we will discuss and update necessary vaccinations and screenings during your next visit.  INSTRUCTIONS:  Please ensure you keep all follow-up appointments as scheduled. Schedule a follow-up visit with your primary care provider in February or March to assess your recovery and adherence to supplements.

## 2023-03-03 DIAGNOSIS — R030 Elevated blood-pressure reading, without diagnosis of hypertension: Secondary | ICD-10-CM | POA: Insufficient documentation

## 2023-03-03 DIAGNOSIS — R7303 Prediabetes: Secondary | ICD-10-CM | POA: Insufficient documentation

## 2023-03-03 DIAGNOSIS — Z862 Personal history of diseases of the blood and blood-forming organs and certain disorders involving the immune mechanism: Secondary | ICD-10-CM | POA: Insufficient documentation

## 2023-03-03 NOTE — Assessment & Plan Note (Signed)
Blood pressure was elevated at 166/76 during the visit, attributed to rushing to the appointment. Improved to 148/79. No history of hypertension noted. Discussed importance of accurate blood pressure measurement, including using an appropriate-sized cuff and taking measurements in a calm setting. - Recheck blood pressure at the end of the visit - Advise home blood pressure monitoring, aiming for readings under 130/80 - Discuss importance of using an appropriate-sized cuff and taking measurements in a calm setting

## 2023-03-03 NOTE — Assessment & Plan Note (Signed)
Hemoglobin was slightly low at 13.0 g/dL. MCV was also slightly low at 79.3 fL, suggesting possible iron deficiency. Already on an iron supplement and provided with a multivitamin with iron. - Continue iron supplementation - Monitor hemoglobin and MCV levels post-surgery

## 2023-03-03 NOTE — Assessment & Plan Note (Signed)
A1c was 6.3% in the most recent test, indicating prediabetes. Previous A1c in September was 6.2%. Bariatric surgery is expected to help reverse prediabetes. - Monitor A1c levels post-surgery - Discuss potential for reversal of prediabetes with weight loss and surgery

## 2023-03-03 NOTE — Assessment & Plan Note (Signed)
Scheduled for bariatric surgery on March 08, 2023. Previously on [place] for weight management, with insurance switching coverage to [facility]. Currently preparing for surgery with a regimen of supplements including a multivitamin with iron. Discussed potential benefits of surgery, including improvement in prediabetes, hypertension, and fatty liver. - Proceed with bariatric surgery on March 08, 2023 - Ensure adherence to prescribed supplements including multivitamin with iron - Schedule follow-up visit in February or March to assess post-surgical recovery and adherence to supplements

## 2023-06-08 ENCOUNTER — Encounter: Payer: Self-pay | Admitting: Family Medicine

## 2023-06-08 ENCOUNTER — Encounter: Payer: BC Managed Care – PPO | Admitting: Family Medicine

## 2023-06-08 DIAGNOSIS — Z0000 Encounter for general adult medical examination without abnormal findings: Secondary | ICD-10-CM

## 2023-06-08 HISTORY — DX: Encounter for general adult medical examination without abnormal findings: Z00.00

## 2023-06-08 NOTE — Progress Notes (Deleted)
 Complete physical exam   Patient: Dustin Collins   DOB: 18-Nov-1985   38 y.o. Male  MRN: [id number] Visit Date: 06/08/2023  Today's healthcare provider: Ronnald Ramp, MD   No chief complaint on file.  Subjective    Dustin Collins is a 38 y.o. male who presents today for a complete physical exam.   He reports consuming a {diet types:17450} diet.   {Exercise:19826}    He {does/does not:200015} have additional problems to discuss today.   Discussed the use of AI scribe software for clinical note transcription with the patient, who gave verbal consent to proceed.  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, has been following up with bariatric surgery    Past Medical History:  Diagnosis Date   Annual physical exam 06/08/2023   Testicular torsion    38 y/o   Past Surgical History:  Procedure Laterality Date   APPENDECTOMY     TONSILLECTOMY     Social History   Socioeconomic History   Marital status: Married    Spouse name: [place]   Number of children: 0   Years of education: Not on file   Highest education level: Master's degree (e.g., MA, MS, MEng, MEd, MSW, MBA)  Occupational History   Not on file  Tobacco Use   Smoking status: Never   Smokeless tobacco: Never  Vaping Use   Vaping status: Never Used  Substance and Sexual Activity   Alcohol use: No   Drug use: No   Sexual activity: Yes    Birth control/protection: None  Other Topics Concern   Not on file  Social History Narrative   Not on file   Social Drivers of Health   Financial Resource Strain: Low Risk  (02/28/2023)   Overall Financial Resource Strain (CARDIA)    Difficulty of Paying Living Expenses: Not hard at all  Food Insecurity: Low Risk  (03/08/2023)   Received from [facility]   Hunger Vital Sign    Worried About Running Out of Food in the Last Year: Never true    Ran Out of Food in the Last Year: Never true  Transportation Needs: No  Transportation Needs (03/08/2023)   Received from [facility]    In the past 12 months, has lack of reliable transportation kept you from medical appointments, meetings, work or from getting things needed for daily living? : No  Physical Activity: Insufficiently Active (02/28/2023)   Exercise Vital Sign    Days of Exercise per Week: 3 days    Minutes of Exercise per Session: 20 min  Stress: No Stress Concern Present (02/28/2023)   [employer] of Occupational Health - Occupational Stress Questionnaire    Feeling of Stress : Not at all  Social Connections: Moderately Integrated (02/28/2023)   Social Connection and Isolation Panel [NHANES]    Frequency of Communication with Friends and Family: More than three times a week    Frequency of Social Gatherings with Friends and Family: Once a week    Attends Religious Services: More than 4 times per year    Active Member of [employer] or Organizations: No    Attends Banker Meetings: Not on file    Marital Status: Married  Intimate Partner Violence: Unknown (09/05/2022)   Received from [facility]   HITS    Physically Hurt: Not on file    Insult or Talk Down To: Not on file  Threaten Physical Harm: Not on file    Scream or Curse: Not on file   Family Status  Relation Name Status   Mother  Deceased   Father  Deceased   MGM  Deceased   MGF  Deceased   PGM  Deceased   PGF  Deceased  No partnership data on file   Family History  Problem Relation Age of Onset   Pneumonia Mother    No Known Allergies   Medications: Outpatient Medications Prior to Visit  Medication Sig   omeprazole (PRILOSEC) 20 MG capsule Take by mouth.   Multiple Vitamin (MULTIVITAMIN) capsule Take 1 capsule by mouth daily.   No facility-administered medications prior to visit.    Review of Systems  {Insert previous labs (optional):23779} {See past labs  Heme  Chem  Endocrine  Serology  Results Review (optional):1}   Objective    There were no vitals taken for this visit. {Insert last BP/Wt (optional):23777}{See vitals history (optional):1}    Physical Exam  ***  Last depression screening scores    03/02/2023    3:14 PM 03/08/2019    2:21 PM 02/28/2019   12:07 PM  PHQ 2/9 Scores  PHQ - 2 Score 0 0 0  PHQ- 9 Score 0      Last fall risk screening    03/02/2023    3:15 PM  Fall Risk   Falls in the past year? 0  Number falls in past yr: 0  Injury with Fall? 0    Last Audit-C alcohol use screening    02/28/2023    9:22 PM  Alcohol Use Disorder Test (AUDIT)  1. How often do you have a drink containing alcohol? 0  3. How often do you have six or more drinks on one occasion? 0   A score of 3 or more in women, and 4 or more in men indicates increased risk for alcohol abuse, EXCEPT if all of the points are from question 1   No results found for any visits on 06/08/23.  Assessment & Plan    Routine Health Maintenance and Physical Exam  Immunization History  Administered Date(s) Administered   PFIZER Comirnaty(Gray Top)Covid-19 Tri-Sucrose Vaccine 04/10/2020    Health Maintenance  Topic Date Due   Hepatitis C Screening  Never done   DTaP/Tdap/Td (1 - Tdap) Never done   INFLUENZA VACCINE  Never done   COVID-19 Vaccine (4 - 2024-25 season) 11/28/2022   HIV Screening  Completed   HPV VACCINES  Aged Out    Problem List Items Addressed This Visit       Other   Prediabetes   Morbid obesity (HCC)   History of iron deficiency anemia   Elevated blood pressure reading   Annual physical exam - Primary   Other Visit Diagnoses       Screening for deficiency anemia         Screening for lipid disorders         Screening for diabetes mellitus         Encounter for hepatitis C screening test for low risk patient           Assessment and Plan                No follow-ups on file.       Ronnald Ramp, MD  [facility] [phone] (phone) [phone] (fax)  [facility]

## 2023-07-14 ENCOUNTER — Encounter: Admitting: Family Medicine

## 2023-08-05 ENCOUNTER — Encounter (HOSPITAL_COMMUNITY): Payer: Self-pay

## 2023-08-12 ENCOUNTER — Encounter: Admitting: Family Medicine

## 2023-10-05 ENCOUNTER — Ambulatory Visit (INDEPENDENT_AMBULATORY_CARE_PROVIDER_SITE_OTHER): Admitting: Family Medicine

## 2023-10-05 ENCOUNTER — Encounter: Payer: Self-pay | Admitting: Family Medicine

## 2023-10-05 VITALS — BP 126/85 | HR 65 | Ht 74.0 in | Wt 362.1 lb

## 2023-10-05 DIAGNOSIS — Z0000 Encounter for general adult medical examination without abnormal findings: Secondary | ICD-10-CM

## 2023-10-05 DIAGNOSIS — Z862 Personal history of diseases of the blood and blood-forming organs and certain disorders involving the immune mechanism: Secondary | ICD-10-CM

## 2023-10-05 DIAGNOSIS — Z9884 Bariatric surgery status: Secondary | ICD-10-CM

## 2023-10-05 DIAGNOSIS — R030 Elevated blood-pressure reading, without diagnosis of hypertension: Secondary | ICD-10-CM

## 2023-10-05 DIAGNOSIS — R7303 Prediabetes: Secondary | ICD-10-CM | POA: Diagnosis not present

## 2023-10-05 DIAGNOSIS — Z1159 Encounter for screening for other viral diseases: Secondary | ICD-10-CM

## 2023-10-05 DIAGNOSIS — Z13220 Encounter for screening for lipoid disorders: Secondary | ICD-10-CM

## 2023-10-05 NOTE — Progress Notes (Unsigned)
 Complete physical exam   Patient: Dustin Collins   DOB: Dec 01, 1985   38 y.o. Male  MRN: [id number] Visit Date: 10/05/2023  Today's healthcare provider: Rockie Agent, MD   Chief Complaint  Patient presents with   Annual Exam    Diet -  Bariatric consuming high protein low carb Exercise - daily for thirty minutes to an hour Feeling - well Sleeping - well Concerns -  none   Care Management    Declined updating vaccines today   Subjective    Dustin Collins is a 38 y.o. male who presents today for a complete physical exam.    He {does/does not:200015} have additional problems to discuss today.   Discussed the use of AI scribe software for clinical note transcription with the patient, who gave verbal consent to proceed.  History of Present Illness      Past Medical History:  Diagnosis Date   Annual physical exam 06/08/2023   Testicular torsion    38 y/o   Past Surgical History:  Procedure Laterality Date   APPENDECTOMY     HERNIA REPAIR  [id number]   TONSILLECTOMY     Social History   Socioeconomic History   Marital status: Married    Spouse name: [place]   Number of children: 0   Years of education: Not on file   Highest education level: Master's degree (e.g., MA, MS, MEng, MEd, MSW, MBA)  Occupational History   Not on file  Tobacco Use   Smoking status: Never   Smokeless tobacco: Never  Vaping Use   Vaping status: Never Used  Substance and Sexual Activity   Alcohol use: No   Drug use: No   Sexual activity: Yes    Birth control/protection: None  Other Topics Concern   Not on file  Social History Narrative   Not on file   Social Drivers of Health   Financial Resource Strain: Low Risk  (02/28/2023)   Overall Financial Resource Strain (CARDIA)    Difficulty of Paying Living Expenses: Not hard at all  Food Insecurity: Low Risk  (03/08/2023)   Received from [facility]   Hunger Vital Sign    Within the past 12 months, you worried that your  food would run out before you got money to buy more: Never true    Within the past 12 months, the food you bought just didn't last and you didn't have money to get more. : Never true  Transportation Needs: No Transportation Needs (03/08/2023)   Received from [facility]    In the past 12 months, has lack of reliable transportation kept you from medical appointments, meetings, work or from getting things needed for daily living? : No  Physical Activity: Insufficiently Active (02/28/2023)   Exercise Vital Sign    Days of Exercise per Week: 3 days    Minutes of Exercise per Session: 20 min  Stress: No Stress Concern Present (02/28/2023)   [employer] of Occupational Health - Occupational Stress Questionnaire    Feeling of Stress : Not at all  Social Connections: Moderately Integrated (02/28/2023)   Social Connection and Isolation Panel    Frequency of Communication with Friends and Family: More than three times a week    Frequency of Social Gatherings with Friends and Family: Once a week    Attends Religious Services: More than 4 times per year    Active Member of [employer] or Organizations: No    Attends Banker  Meetings: Not on file    Marital Status: Married  Intimate Partner Violence: Unknown (09/05/2022)   Received from [facility]   HITS    Physically Hurt: Not on file    Insult or Talk Down To: Not on file    Threaten Physical Harm: Not on file    Scream or Curse: Not on file   Family Status  Relation Name Status   Mother  Deceased   Father  Deceased   MGM  Deceased   MGF  Deceased   PGM  Deceased   PGF  Deceased  No partnership data on file   Family History  Problem Relation Age of Onset   Pneumonia Mother    No Known Allergies   Medications: Outpatient Medications Prior to Visit  Medication Sig   Multiple Vitamin (MULTIVITAMIN) capsule Take 1 capsule by mouth daily.   omeprazole (PRILOSEC) 20 MG capsule Take by mouth. (Patient  taking differently: Take by mouth. Taking as needed)   No facility-administered medications prior to visit.    Review of Systems  {Insert previous labs (optional):23779} {See past labs  Heme  Chem  Endocrine  Serology  Results Review (optional):1}  Objective    BP 126/85 (BP Location: Right Arm, Patient Position: Sitting, Cuff Size: Large)   Pulse 65   Ht 6' 2 (1.88 m)   Wt (!) 362 lb 1.6 oz (164.2 kg)   SpO2 99%   BMI 46.49 kg/m  BP Readings from Last 3 Encounters:  10/05/23 126/85  03/02/23 (!) 148/79  11/02/19 126/83   Wt Readings from Last 3 Encounters:  10/05/23 (!) 362 lb 1.6 oz (164.2 kg)  03/02/23 (!) 450 lb 6.4 oz (204.3 kg)  03/08/19 (!) 452 lb 6.4 oz (205.2 kg)    {See vitals history (optional):1}    Physical Exam  ***  Last depression screening scores    03/02/2023    3:14 PM 03/08/2019    2:21 PM 02/28/2019   12:07 PM  PHQ 2/9 Scores  PHQ - 2 Score 0 0 0  PHQ- 9 Score 0      Last fall risk screening    03/02/2023    3:15 PM  Fall Risk   Falls in the past year? 0  Number falls in past yr: 0  Injury with Fall? 0    Last Audit-C alcohol use screening    02/28/2023    9:22 PM  Alcohol Use Disorder Test (AUDIT)  1. How often do you have a drink containing alcohol? 0  3. How often do you have six or more drinks on one occasion? 0   A score of 3 or more in women, and 4 or more in men indicates increased risk for alcohol abuse, EXCEPT if all of the points are from question 1   No results found for any visits on 10/05/23.  Assessment & Plan    Routine Health Maintenance and Physical Exam  Immunization History  Administered Date(s) Administered   PFIZER Comirnaty(Gray Top)Covid-19 Tri-Sucrose Vaccine 04/10/2020   PFIZER(Purple Top)SARS-COV-2 Vaccination 06/09/2019, 07/03/2019    Health Maintenance  Topic Date Due   Hepatitis C Screening  Never done   DTaP/Tdap/Td (1 - Tdap) Never done   Hepatitis B Vaccines (1 of 3 - 19+ 3-dose  series) Never done   HPV VACCINES (1 - 3-dose SCDM series) Never done   COVID-19 Vaccine (4 - 2024-25 season) 11/28/2022   INFLUENZA VACCINE  10/28/2023   HIV Screening  Completed  Meningococcal B Vaccine  Aged Out    Problem List Items Addressed This Visit       Other   Prediabetes   Morbid obesity (HCC)   History of iron deficiency anemia   Elevated blood pressure reading   Annual physical exam - Primary   Other Visit Diagnoses       Screening for lipid disorders         Encounter for hepatitis C screening test for low risk patient           Assessment and Plan Assessment & Plan        No follow-ups on file.       Rockie Agent, MD  [facility] [phone] (phone) [phone] (fax)  [facility]

## 2023-10-06 ENCOUNTER — Ambulatory Visit: Payer: Self-pay | Admitting: Family Medicine

## 2023-10-06 LAB — CMP14+EGFR
ALT: 9 IU/L (ref 0–44)
AST: 17 IU/L (ref 0–40)
Albumin: 4.3 g/dL (ref 4.1–5.1)
Alkaline Phosphatase: 88 IU/L (ref 44–121)
BUN/Creatinine Ratio: 13 (ref 9–20)
BUN: 12 mg/dL (ref 6–20)
Bilirubin Total: 0.6 mg/dL (ref 0.0–1.2)
CO2: 23 mmol/L (ref 20–29)
Calcium: 9.1 mg/dL (ref 8.7–10.2)
Chloride: 99 mmol/L (ref 96–106)
Creatinine, Ser: 0.96 mg/dL (ref 0.76–1.27)
Globulin, Total: 3.4 g/dL (ref 1.5–4.5)
Glucose: 78 mg/dL (ref 70–99)
Potassium: 4.3 mmol/L (ref 3.5–5.2)
Sodium: 138 mmol/L (ref 134–144)
Total Protein: 7.7 g/dL (ref 6.0–8.5)
eGFR: 105 mL/min/1.73 (ref >59)

## 2023-10-06 LAB — LIPID PANEL
Chol/HDL Ratio: 3.8 ratio (ref 0.0–5.0)
Cholesterol, Total: 146 mg/dL (ref 100–199)
HDL: 38 mg/dL — ABNORMAL LOW (ref >39)
LDL Chol Calc (NIH): 94 mg/dL (ref 0–99)
Triglycerides: 70 mg/dL (ref 0–149)
VLDL Cholesterol Cal: 14 mg/dL (ref 5–40)

## 2023-10-06 LAB — CBC
Hematocrit: 44 % (ref 37.5–51.0)
Hemoglobin: 13.8 g/dL (ref 13.0–17.7)
MCH: 25.4 pg — ABNORMAL LOW (ref 26.6–33.0)
MCHC: 31.4 g/dL — ABNORMAL LOW (ref 31.5–35.7)
MCV: 81 fL (ref 79–97)
Platelets: 341 x10E3/uL (ref 150–450)
RBC: 5.44 x10E6/uL (ref 4.14–5.80)
RDW: 13.6 % (ref 11.6–15.4)
WBC: 7.6 x10E3/uL (ref 3.4–10.8)

## 2023-10-06 LAB — VITAMIN B12: Vitamin B-12: 656 pg/mL (ref 232–1245)

## 2023-10-06 LAB — VITAMIN D 25 HYDROXY (VIT D DEFICIENCY, FRACTURES): Vit D, 25-Hydroxy: 22.6 ng/mL — ABNORMAL LOW (ref 30.0–100.0)

## 2023-10-06 LAB — TSH+T4F+T3FREE
Free T4: 1.27 ng/dL (ref 0.82–1.77)
T3, Free: 2.8 pg/mL (ref 2.0–4.4)
TSH: 1.01 u[IU]/mL (ref 0.450–4.500)

## 2023-10-06 LAB — HEMOGLOBIN A1C
Est. average glucose Bld gHb Est-mCnc: 114 mg/dL
Hgb A1c MFr Bld: 5.6 % (ref 4.8–5.6)

## 2023-10-06 LAB — HEPATITIS C ANTIBODY: Hep C Virus Ab: NONREACTIVE

## 2024-10-05 ENCOUNTER — Encounter: Admitting: Family Medicine
